# Patient Record
Sex: Male | Born: 1935 | Race: White | Hispanic: No | Smoking: Former smoker
Health system: Southern US, Community
[De-identification: ages and names within clinical notes are randomized; demographics above are authoritative.]

## PROBLEM LIST (undated history)

## (undated) DIAGNOSIS — H409 Unspecified glaucoma: Secondary | ICD-10-CM

## (undated) DIAGNOSIS — E785 Hyperlipidemia, unspecified: Secondary | ICD-10-CM

## (undated) DIAGNOSIS — F0280 Dementia in other diseases classified elsewhere without behavioral disturbance: Secondary | ICD-10-CM

## (undated) DIAGNOSIS — Z950 Presence of cardiac pacemaker: Secondary | ICD-10-CM

## (undated) DIAGNOSIS — J449 Chronic obstructive pulmonary disease, unspecified: Secondary | ICD-10-CM

## (undated) DIAGNOSIS — C3490 Malignant neoplasm of unspecified part of unspecified bronchus or lung: Secondary | ICD-10-CM

## (undated) DIAGNOSIS — G309 Alzheimer's disease, unspecified: Secondary | ICD-10-CM

## (undated) DIAGNOSIS — R339 Retention of urine, unspecified: Secondary | ICD-10-CM

## (undated) DIAGNOSIS — D649 Anemia, unspecified: Secondary | ICD-10-CM

## (undated) HISTORY — DX: Anemia, unspecified: D64.9

## (undated) HISTORY — DX: Presence of cardiac pacemaker: Z95.0

## (undated) HISTORY — PX: PACEMAKER INSERTION: SHX728

## (undated) HISTORY — DX: Malignant neoplasm of unspecified part of unspecified bronchus or lung: C34.90

## (undated) HISTORY — DX: Unspecified glaucoma: H40.9

## (undated) HISTORY — DX: Hyperlipidemia, unspecified: E78.5

## (undated) HISTORY — PX: STOMACH SURGERY: SHX791

## (undated) HISTORY — DX: Alzheimer's disease, unspecified: G30.9

## (undated) HISTORY — DX: Chronic obstructive pulmonary disease, unspecified: J44.9

## (undated) HISTORY — DX: Retention of urine, unspecified: R33.9

## (undated) HISTORY — DX: Dementia in other diseases classified elsewhere, unspecified severity, without behavioral disturbance, psychotic disturbance, mood disturbance, and anxiety: F02.80

---

## 2005-02-18 ENCOUNTER — Ambulatory Visit: Payer: Self-pay | Admitting: Internal Medicine

## 2005-05-25 ENCOUNTER — Ambulatory Visit: Payer: Self-pay | Admitting: Vascular Surgery

## 2005-06-06 ENCOUNTER — Ambulatory Visit: Payer: Self-pay | Admitting: Vascular Surgery

## 2005-06-07 ENCOUNTER — Ambulatory Visit: Payer: Self-pay | Admitting: Vascular Surgery

## 2005-06-28 ENCOUNTER — Ambulatory Visit: Payer: Self-pay | Admitting: Vascular Surgery

## 2005-07-05 ENCOUNTER — Inpatient Hospital Stay: Payer: Self-pay | Admitting: Vascular Surgery

## 2006-10-03 ENCOUNTER — Emergency Department: Payer: Self-pay | Admitting: Emergency Medicine

## 2006-10-04 ENCOUNTER — Ambulatory Visit: Payer: Self-pay | Admitting: Emergency Medicine

## 2008-08-14 ENCOUNTER — Inpatient Hospital Stay: Payer: Self-pay | Admitting: Internal Medicine

## 2009-08-07 ENCOUNTER — Inpatient Hospital Stay: Payer: Self-pay | Admitting: Internal Medicine

## 2009-08-25 ENCOUNTER — Ambulatory Visit: Payer: Self-pay | Admitting: Specialist

## 2009-09-28 ENCOUNTER — Ambulatory Visit: Payer: Self-pay | Admitting: Specialist

## 2009-10-06 ENCOUNTER — Ambulatory Visit: Payer: Self-pay | Admitting: Oncology

## 2009-10-07 ENCOUNTER — Ambulatory Visit: Payer: Self-pay | Admitting: Oncology

## 2009-11-03 ENCOUNTER — Ambulatory Visit: Payer: Self-pay | Admitting: Oncology

## 2009-11-06 ENCOUNTER — Ambulatory Visit: Payer: Self-pay | Admitting: Oncology

## 2009-12-06 ENCOUNTER — Ambulatory Visit: Payer: Self-pay | Admitting: Oncology

## 2009-12-29 ENCOUNTER — Emergency Department: Payer: Self-pay

## 2010-01-06 ENCOUNTER — Ambulatory Visit: Payer: Self-pay | Admitting: Oncology

## 2010-02-05 ENCOUNTER — Ambulatory Visit: Payer: Self-pay | Admitting: Oncology

## 2010-03-31 ENCOUNTER — Ambulatory Visit: Payer: Self-pay | Admitting: Oncology

## 2010-05-08 ENCOUNTER — Ambulatory Visit: Payer: Self-pay | Admitting: Oncology

## 2010-05-11 ENCOUNTER — Ambulatory Visit: Payer: Self-pay | Admitting: Oncology

## 2010-06-08 ENCOUNTER — Ambulatory Visit: Payer: Self-pay | Admitting: Oncology

## 2010-08-12 ENCOUNTER — Ambulatory Visit: Payer: Self-pay | Admitting: Oncology

## 2010-08-17 ENCOUNTER — Ambulatory Visit: Payer: Self-pay | Admitting: Oncology

## 2010-09-08 ENCOUNTER — Ambulatory Visit: Payer: Self-pay | Admitting: Oncology

## 2011-01-01 ENCOUNTER — Emergency Department: Payer: Self-pay | Admitting: Internal Medicine

## 2012-01-24 IMAGING — CT NM PET [PERSON_NAME] LTD AREA
5 series · 25 of 25 positions shown · non-contrast
Comparison: none

REASON FOR EXAM: r middle lobe mass
COMMENTS:

PROCEDURE:     PET - PET/CT DX LUNG CA  - September 28, 2009 [DATE]
RESULT:
HISTORY: Lung cancer.

[Series 3: ct wb 3.0 b30f · axial · 3.0mm · 0.98mm/px · z∈[-78,+790]mm · 12 of 435 slices shown]
[im 1/435  soft-tissue]
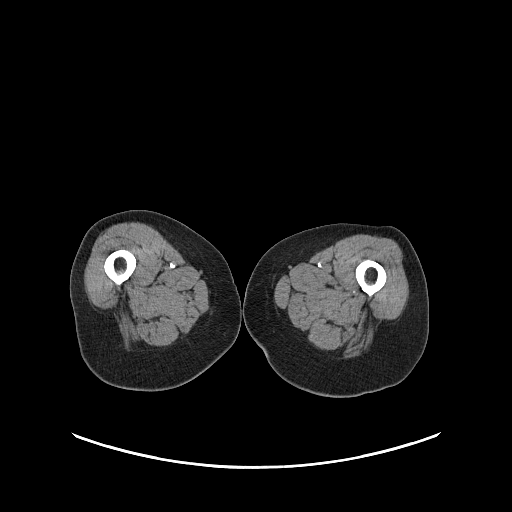
[im 40/435  soft-tissue]
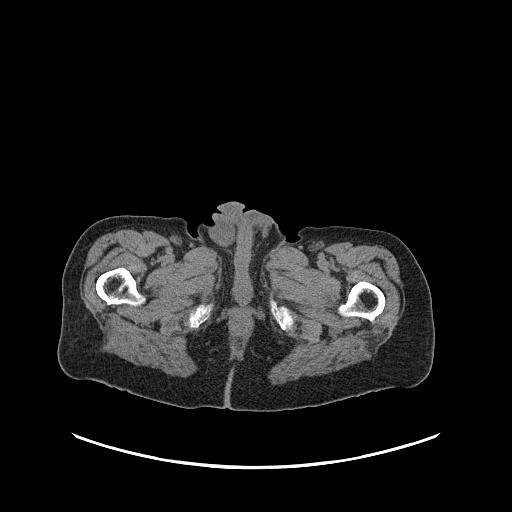
[im 79/435  soft-tissue]
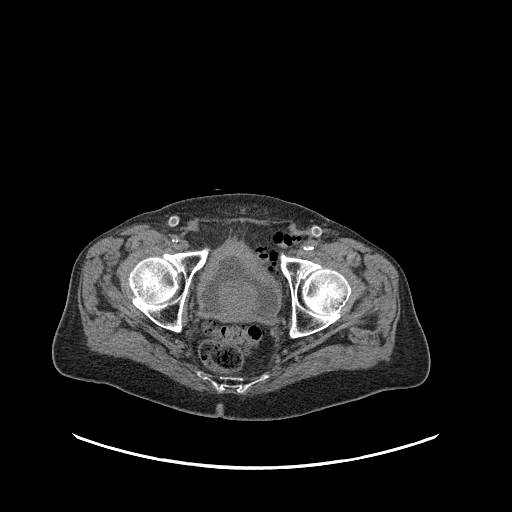
[im 119/435  soft-tissue]
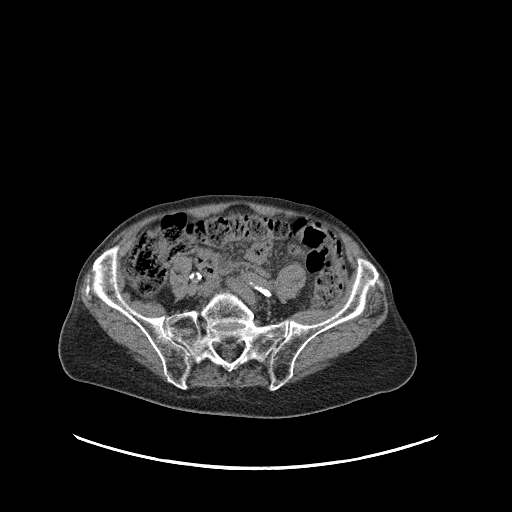
[im 158/435  soft-tissue]
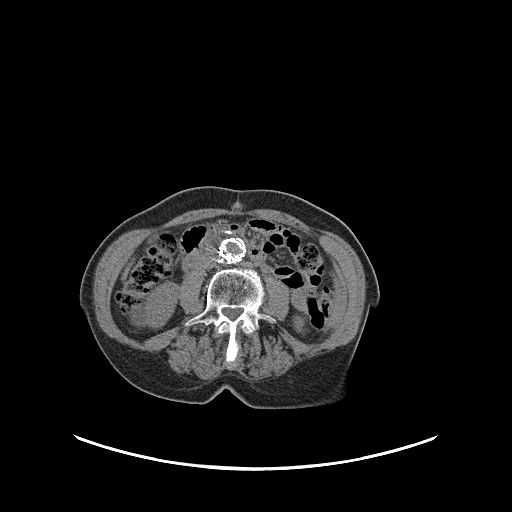
[im 198/435  soft-tissue]
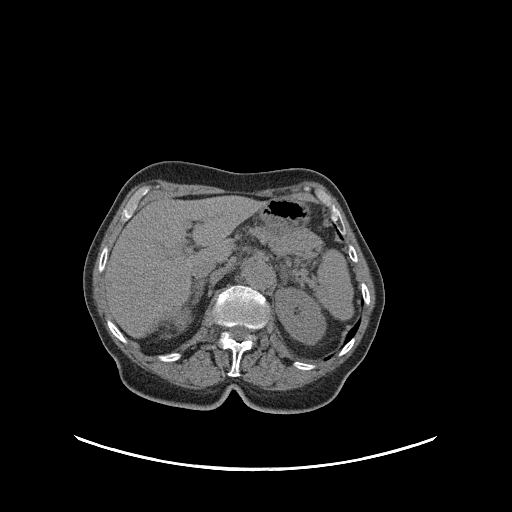
[im 237/435  soft-tissue]
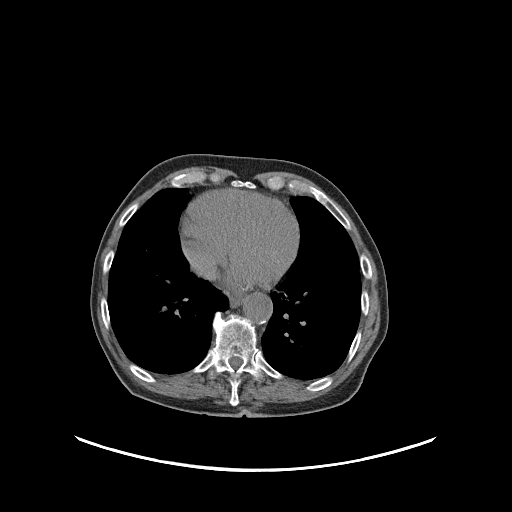
[im 277/435  soft-tissue]
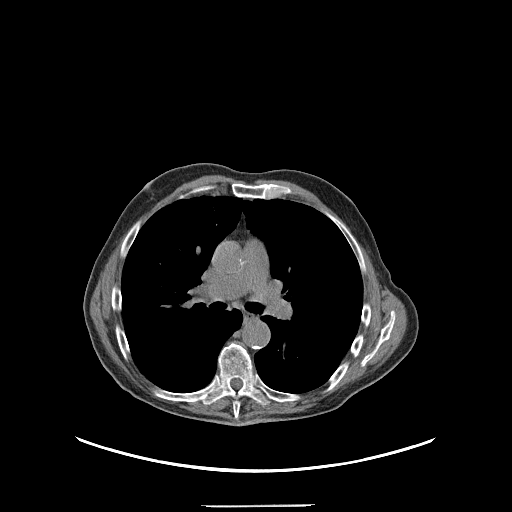
[im 316/435  soft-tissue]
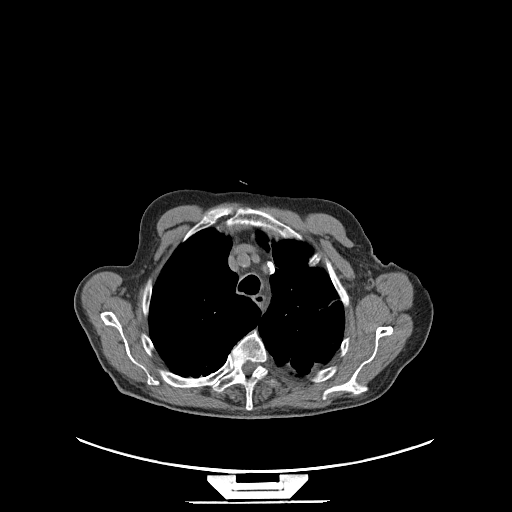
[im 356/435  soft-tissue]
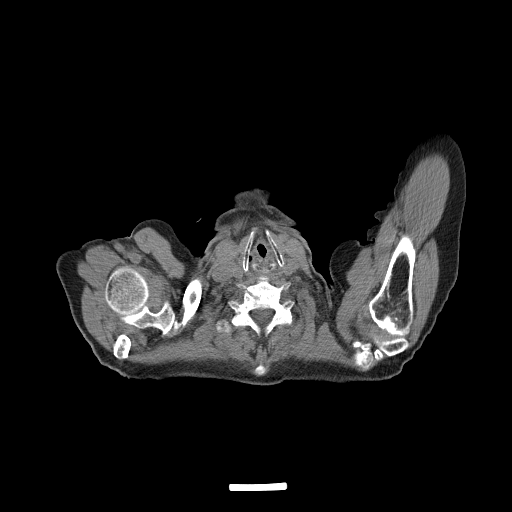
[im 395/435  soft-tissue]
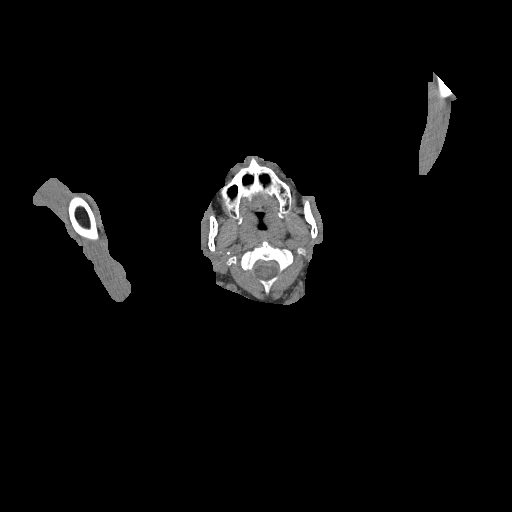
[im 435/435  soft-tissue]
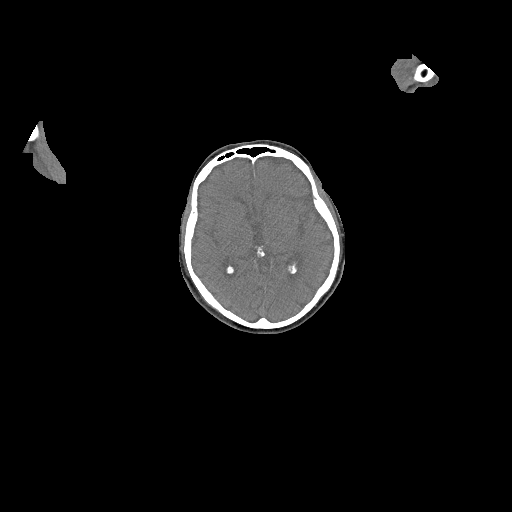

[Series 102: pet wb · axial · 5.0mm · 4.07mm/px · z∈[-76,+790]mm · 7 of 290 slices shown]
[im 1/290]
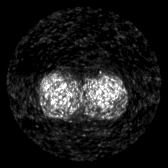
[im 49/290]
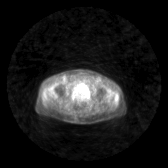
[im 97/290]
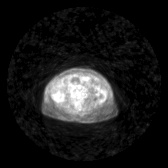
[im 145/290]
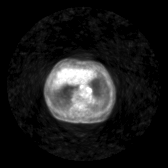
[im 193/290]
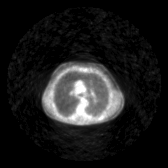
[im 241/290]
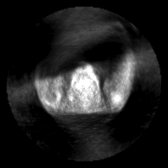
[im 290/290]
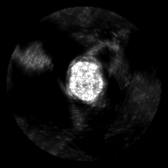

[Series 604: pet coronal · 1 of 55 slices shown]
[im 1/55]
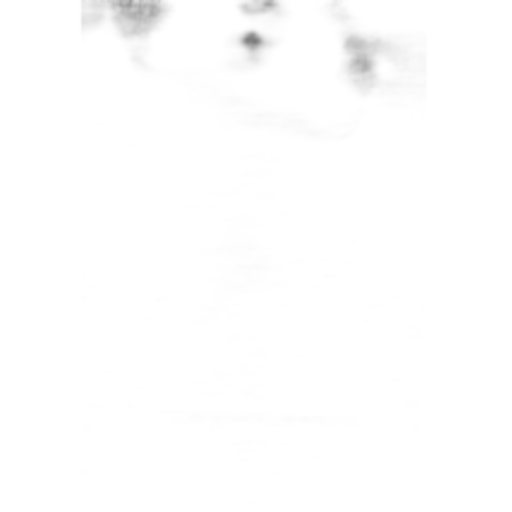

[Series 605: pet sagittal · 1 of 58 slices shown]
[im 1/58]
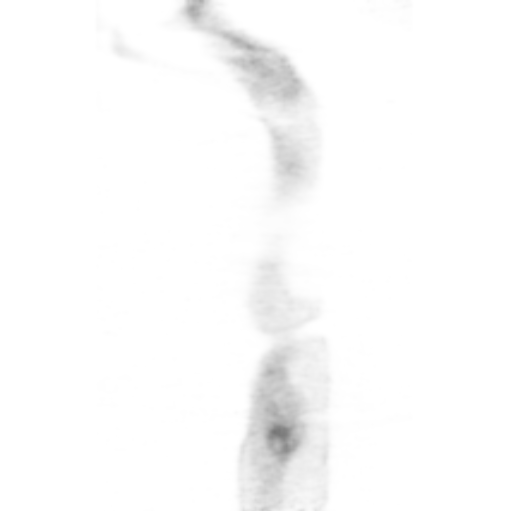

[Series 606: pet axial · 4 of 168 slices shown]
[im 1/168]
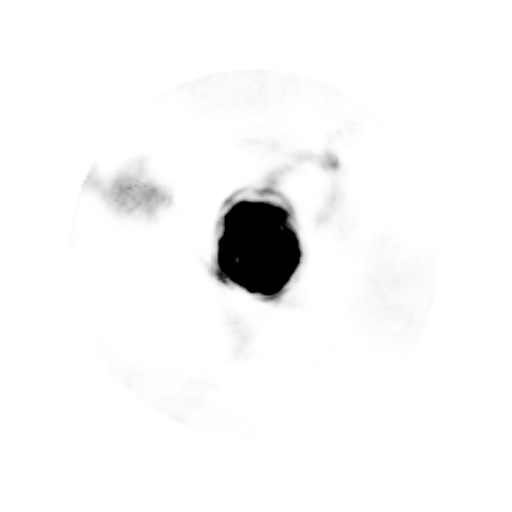
[im 56/168]
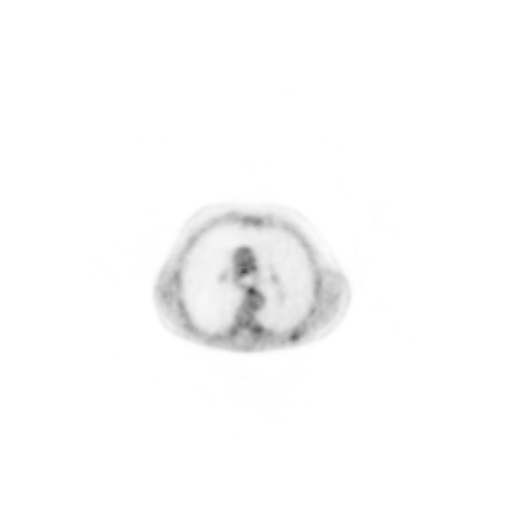
[im 112/168]
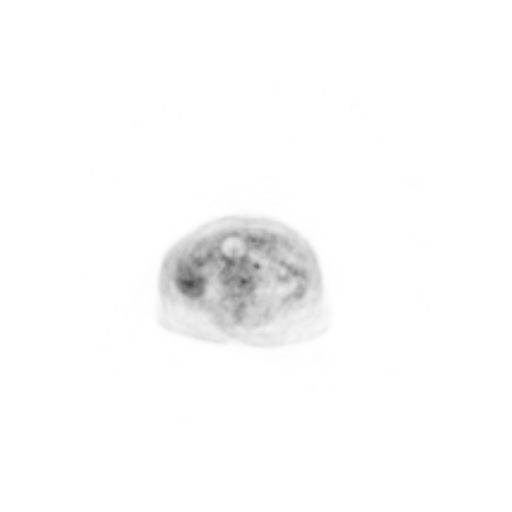
[im 168/168]
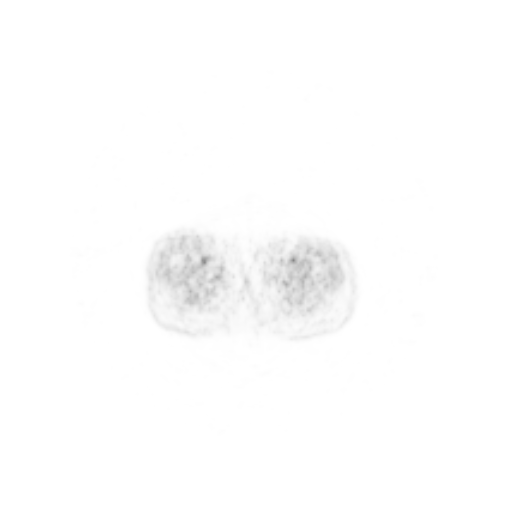

[25 of 25 positions shown; findings below may reference images not displayed]

PROCEDURE AND FINDINGS:  Following a fasting blood sugar determination of 95
mg/dl, 13.87 millicuries of F-18 FDG was administered to the patient and PET
CT was obtained. There is a left upper lobe mass lesion that is intensely
PET positive with SUV of 9. This is consistent with lung carcinoma. The
chest is otherwise unremarkable. Neck is unremarkable. Upper abdomen
demonstrates equivocal area of increased activity. This is in a region of
left base atelectatic change. Mild pneumonitis could present in this
fashion. Also noted in the pelvis is prostatic enlargement with a region of
increased SUV in the posterior right portion of the prostate gland. SUV in
this region is 3.5. This could represent focal area of prostatitis or
prostate cancer. The patient has aortic stent graft. Atelectatic changes in
the right middle lobe are not intensely PET positive.
IMPRESSION: 1. Intensely PET positive left upper lung pulmonary nodule with SUV levels
of 9. This is most consistent with bronchogenic carcinoma.
2. Focal prostatitis and/or prostate cancer right posterior inferior aspect
of the prostate gland. The prostate is enlarged as well.
3. Focal equivocal area of increased activity in the left lung base
medially. This is most likely atelectatic change. Right middle lobe
atelectatic changes present on CT is not intensely PET positive.

## 2012-03-07 ENCOUNTER — Ambulatory Visit: Payer: Self-pay | Admitting: Oncology

## 2012-03-20 ENCOUNTER — Ambulatory Visit: Payer: Self-pay | Admitting: Oncology

## 2012-03-20 LAB — COMPREHENSIVE METABOLIC PANEL
Albumin: 3.4 g/dL (ref 3.4–5.0)
Alkaline Phosphatase: 99 U/L (ref 50–136)
BUN: 21 mg/dL — ABNORMAL HIGH (ref 7–18)
Bilirubin,Total: 0.5 mg/dL (ref 0.2–1.0)
Calcium, Total: 9.2 mg/dL (ref 8.5–10.1)
Chloride: 100 mmol/L (ref 98–107)
Co2: 30 mmol/L (ref 21–32)
EGFR (African American): >60
EGFR (Non-African Amer.): >60
Potassium: 4.7 mmol/L (ref 3.5–5.1)
SGOT(AST): 21 U/L (ref 15–37)
SGPT (ALT): 18 U/L (ref 12–78)
Total Protein: 7.9 g/dL (ref 6.4–8.2)

## 2012-03-20 LAB — CBC CANCER CENTER
Basophil #: 0 x10 3/mm (ref 0.0–0.1)
Basophil %: 0.2 %
HCT: 40.7 % (ref 40.0–52.0)
HGB: 13.2 g/dL (ref 13.0–18.0)
Lymphocyte #: 1.2 x10 3/mm (ref 1.0–3.6)
Lymphocyte %: 13.4 %
MCH: 30.6 pg (ref 26.0–34.0)
MCV: 94 fL (ref 80–100)
Monocyte #: 1.1 x10 3/mm — ABNORMAL HIGH (ref 0.2–1.0)
Monocyte %: 12.1 %
Neutrophil #: 6.5 x10 3/mm (ref 1.4–6.5)
Platelet: 195 x10 3/mm (ref 150–440)
RDW: 15.1 % — ABNORMAL HIGH (ref 11.5–14.5)

## 2012-04-08 ENCOUNTER — Ambulatory Visit: Payer: Self-pay | Admitting: Oncology

## 2012-05-08 ENCOUNTER — Ambulatory Visit: Payer: Self-pay | Admitting: Oncology

## 2012-05-14 LAB — PROTIME-INR
INR: 1
Prothrombin Time: 13.1 secs (ref 11.5–14.7)

## 2012-05-14 LAB — APTT: Activated PTT: 31 secs (ref 23.6–35.9)

## 2012-05-15 ENCOUNTER — Ambulatory Visit: Payer: Self-pay | Admitting: Oncology

## 2012-06-06 LAB — BASIC METABOLIC PANEL
Calcium, Total: 9.2 mg/dL (ref 8.5–10.1)
Chloride: 102 mmol/L (ref 98–107)
Co2: 26 mmol/L (ref 21–32)
Creatinine: 0.97 mg/dL (ref 0.60–1.30)
EGFR (African American): >60
Glucose: 93 mg/dL (ref 65–99)
Osmolality: 280 (ref 275–301)
Sodium: 140 mmol/L (ref 136–145)

## 2012-06-08 ENCOUNTER — Ambulatory Visit: Payer: Self-pay | Admitting: Oncology

## 2012-07-02 ENCOUNTER — Ambulatory Visit: Payer: Self-pay | Admitting: Internal Medicine

## 2012-08-14 ENCOUNTER — Ambulatory Visit: Payer: Self-pay | Admitting: Oncology

## 2012-08-16 ENCOUNTER — Ambulatory Visit: Payer: Self-pay | Admitting: Oncology

## 2012-09-08 ENCOUNTER — Ambulatory Visit: Payer: Self-pay | Admitting: Oncology

## 2012-12-17 ENCOUNTER — Ambulatory Visit: Payer: Self-pay | Admitting: Oncology

## 2012-12-18 ENCOUNTER — Ambulatory Visit: Payer: Self-pay | Admitting: Oncology

## 2013-02-10 ENCOUNTER — Inpatient Hospital Stay: Payer: Self-pay | Admitting: Internal Medicine

## 2013-02-10 DIAGNOSIS — Z950 Presence of cardiac pacemaker: Secondary | ICD-10-CM | POA: Insufficient documentation

## 2013-02-10 LAB — CK TOTAL AND CKMB (NOT AT ARMC)
CK, Total: 38 U/L (ref 35–232)
CK, Total: 73 U/L (ref 35–232)
CK-MB: 1.8 ng/mL (ref 0.5–3.6)

## 2013-02-10 LAB — URINALYSIS, COMPLETE
Bacteria: NONE SEEN
Bilirubin,UR: NEGATIVE
Glucose,UR: NEGATIVE mg/dL (ref 0–75)
Ketone: NEGATIVE
Leukocyte Esterase: NEGATIVE
Nitrite: NEGATIVE
Ph: 5 (ref 4.5–8.0)
Protein: NEGATIVE
RBC,UR: <1 /HPF (ref 0–5)
Specific Gravity: 1.013 (ref 1.003–1.030)
Squamous Epithelial: NONE SEEN
WBC UR: 2 /HPF (ref 0–5)

## 2013-02-10 LAB — COMPREHENSIVE METABOLIC PANEL
Anion Gap: 12 (ref 7–16)
Chloride: 105 mmol/L (ref 98–107)
Creatinine: 1.68 mg/dL — ABNORMAL HIGH (ref 0.60–1.30)
EGFR (Non-African Amer.): 36 — ABNORMAL LOW
Glucose: 166 mg/dL — ABNORMAL HIGH (ref 65–99)
SGPT (ALT): 62 U/L (ref 12–78)
Total Protein: 7.1 g/dL (ref 6.4–8.2)

## 2013-02-10 LAB — CBC
HCT: 40 % (ref 40.0–52.0)
HGB: 13 g/dL (ref 13.0–18.0)
MCH: 31.3 pg (ref 26.0–34.0)
MCHC: 32.6 g/dL (ref 32.0–36.0)
MCV: 96 fL (ref 80–100)
Platelet: 164 10*3/uL (ref 150–440)
RBC: 4.17 10*6/uL — ABNORMAL LOW (ref 4.40–5.90)
RDW: 14.9 % — ABNORMAL HIGH (ref 11.5–14.5)

## 2013-02-10 LAB — MAGNESIUM
Magnesium: 1.7 mg/dL — ABNORMAL LOW
Magnesium: 2.5 mg/dL — ABNORMAL HIGH

## 2013-02-10 LAB — TROPONIN I
Troponin-I: 0.23 ng/mL — ABNORMAL HIGH
Troponin-I: 0.24 ng/mL — ABNORMAL HIGH

## 2013-02-10 LAB — TSH: Thyroid Stimulating Horm: 1.23 u[IU]/mL

## 2013-02-10 LAB — PRO B NATRIURETIC PEPTIDE: B-Type Natriuretic Peptide: 10653 pg/mL — ABNORMAL HIGH (ref 0–450)

## 2013-02-11 LAB — BASIC METABOLIC PANEL
Anion Gap: 9 (ref 7–16)
BUN: 21 mg/dL — ABNORMAL HIGH (ref 7–18)
Calcium, Total: 8.7 mg/dL (ref 8.5–10.1)
Chloride: 108 mmol/L — ABNORMAL HIGH (ref 98–107)
Co2: 24 mmol/L (ref 21–32)
Creatinine: 1.14 mg/dL (ref 0.60–1.30)
EGFR (African American): >60
EGFR (Non-African Amer.): 57 — ABNORMAL LOW
Glucose: 99 mg/dL (ref 65–99)
Osmolality: 284 (ref 275–301)
Sodium: 141 mmol/L (ref 136–145)

## 2013-02-11 LAB — CBC WITH DIFFERENTIAL/PLATELET
Basophil #: 0 10*3/uL (ref 0.0–0.1)
Basophil %: 0.2 %
Eosinophil #: 0 10*3/uL (ref 0.0–0.7)
HCT: 39.7 % — ABNORMAL LOW (ref 40.0–52.0)
HGB: 13.4 g/dL (ref 13.0–18.0)
Lymphocyte #: 0.6 10*3/uL — ABNORMAL LOW (ref 1.0–3.6)
Lymphocyte %: 6.1 %
MCH: 32.2 pg (ref 26.0–34.0)
MCHC: 33.6 g/dL (ref 32.0–36.0)
MCV: 96 fL (ref 80–100)
Monocyte #: 0.9 x10 3/mm (ref 0.2–1.0)
Monocyte %: 9.9 %
Neutrophil #: 7.7 10*3/uL — ABNORMAL HIGH (ref 1.4–6.5)
Neutrophil %: 83.5 %
Platelet: 142 10*3/uL — ABNORMAL LOW (ref 150–440)
RDW: 14.5 % (ref 11.5–14.5)
WBC: 9.2 10*3/uL (ref 3.8–10.6)

## 2013-02-11 LAB — CK TOTAL AND CKMB (NOT AT ARMC): CK, Total: 46 U/L (ref 35–232)

## 2013-02-11 LAB — APTT: Activated PTT: 29.7 s

## 2013-02-11 LAB — PROTIME-INR: Prothrombin Time: 13.9 secs (ref 11.5–14.7)

## 2013-11-02 DIAGNOSIS — E785 Hyperlipidemia, unspecified: Secondary | ICD-10-CM | POA: Insufficient documentation

## 2013-11-02 DIAGNOSIS — J449 Chronic obstructive pulmonary disease, unspecified: Secondary | ICD-10-CM | POA: Insufficient documentation

## 2013-12-26 DIAGNOSIS — R0681 Apnea, not elsewhere classified: Secondary | ICD-10-CM | POA: Insufficient documentation

## 2013-12-29 DIAGNOSIS — D510 Vitamin B12 deficiency anemia due to intrinsic factor deficiency: Secondary | ICD-10-CM | POA: Insufficient documentation

## 2013-12-29 DIAGNOSIS — C3412 Malignant neoplasm of upper lobe, left bronchus or lung: Secondary | ICD-10-CM | POA: Insufficient documentation

## 2013-12-29 DIAGNOSIS — I739 Peripheral vascular disease, unspecified: Secondary | ICD-10-CM | POA: Insufficient documentation

## 2014-11-26 DIAGNOSIS — G301 Alzheimer's disease with late onset: Secondary | ICD-10-CM

## 2014-11-26 DIAGNOSIS — F0281 Dementia in other diseases classified elsewhere with behavioral disturbance: Secondary | ICD-10-CM | POA: Insufficient documentation

## 2014-11-26 DIAGNOSIS — F0280 Dementia in other diseases classified elsewhere without behavioral disturbance: Secondary | ICD-10-CM | POA: Insufficient documentation

## 2014-11-28 NOTE — Op Note (Signed)
PATIENT NAME:  Johnathan Dunn, Johnathan Dunn MR#:  [id number] DATE OF BIRTH:  [birth year]  DATE OF PROCEDURE:  02/11/2013  PRIMARY CARE PHYSICIAN: Tracie Harrier, MD   PREPROCEDURE DIAGNOSIS: Complete heart block.   PROCEDURE: Dual chamber pacemaker implantation.   POSTPROCEDURE DIAGNOSIS: Atrial sensing with ventricular pacing.   INDICATION: The patient is an 81 year old gentleman who presented to [facility] Emergency Room with a 2 to 3-weeks history of shortness of breath, weakness and presyncope. The patient was noted to be bradycardic, EKG demonstrating evidence for AV dissociation or complete heart block. The procedure, risks, benefits and alternatives of permanent pacemaker implantation were explained to the patient, and informed written consent was obtained.   DESCRIPTION OF PROCEDURE: He was brought to the operating room in a fasting state. The left pectoral region was prepped and draped in the usual sterile manner. Anesthesia was obtained with 1% Xylocaine locally. A 6 cm incision was performed over the left pectoral region. The pacemaker pocket was generated by electrocautery and blunt dissection. Access was obtained to the left subclavian vein by fine needle aspiration. Right ventricular (9191) and atrial (5592) leads were positioned in the right ventricular apex and right atrial appendage under fluoroscopic guidance. After proper thresholds were attained, the leads were sutured in place. The pacemaker pocket was irrigated with gentamicin solution. The leads were connected to a dual chamber rate-responsive pacemaker generator (Medtronic Adapta [id number]) and positioned into the pocket. The pocket was closed with 2-0 and 4-0 Vicryl, respectively. Steri-Strips and pressure dressing were applied.  ____________________________ Isaias Cowman, MD ap:cb D: 02/11/2013 13:30:04 ET T: 02/11/2013 20:24:49 ET JOB#: [id number]  cc: Isaias Cowman, MD, <Dictator> Isaias Cowman MD ELECTRONICALLY SIGNED  02/26/2013 12:29

## 2014-11-28 NOTE — Discharge Summary (Signed)
PATIENT NAME:  Johnathan Dunn, Johnathan Dunn MR#:  [id number] DATE OF BIRTH:  Aug 31, 1922  DATE OF ADMISSION:  02/10/2013 DATE OF DISCHARGE:  02/12/2013  DISCHARGE DIAGNOSES: 1.  Third-degree arteriovenous block.  2.  Encephalopathy.   DISCHARGE MEDICATIONS: 1.  Pravachol 40 mg a day.  2.  Aspirin 81 mg a day.  3.  Tramadol 50 mg, 1 to 2 every 6 hours p.r.n.   HISTORY AND PHYSICAL: Please see detailed history and physical done on admission.   HOSPITAL COURSE: The patient was admitted weak, pulse on the 20s, with an escape rhythm noted. Found to be in third-degree AV block. Cardiology was consulted. Temporary pacemaker was put in. Eventually permanent pacemaker was put in. He was confused after the pacemaker and overnight, receiving several medications.   Physical therapy saw him. Was safe to ambulate. Confusion was clearing. He was then discharged to home after discussion with the family. Cardiology will follow up soon re  pacemaker wound, etc. Workup was unremarkable otherwise. Chest x-ray, post-pacemaker showed atelectasis only.   CK, CK-MB were normal. Minimal bump in troponin, which was not thought to be an acute coronary syndrome. UA was normal as well.   TSH was, in fact, normal.    ____________________________ Ocie Cornfield. Ouida Sills, MD mwa:dm D: 02/14/2013 10:10:26 ET T: 02/14/2013 10:48:16 ET JOB#: [id number]  cc: Ocie Cornfield. Ouida Sills, MD, <Dictator> Kirk Ruths MD ELECTRONICALLY SIGNED 02/15/2013 6:49

## 2014-11-28 NOTE — H&P (Signed)
PATIENT NAME:  Johnathan, Dunn MR#:  [id number] DATE OF BIRTH:  1922/12/01  DATE OF ADMISSION:  02/10/2013  CHIEF COMPLAINT: Shortness of breath, weakness, lightheadedness.   HISTORY OF PRESENT ILLNESS: Johnathan Dunn is an 81 year old with a history of hypertension, coronary artery disease, hyperlipidemia, COPD, who presented the ED brought by family complaining of shortness of breath for the last 2 to 3 weeks. The patient also reports weakness associated with lightheadedness. Denies any loss of consciousness. No ankle edema. No chest pain. In the ED the patient was noted to have a heart rate of 27, and was noted to be tachypneic. EKG showed evidence of third-degree AV block, with a complete dissociation and junctional rhythm, with a rate of 28. Patient's troponin was also slightly elevated 0.23.   PAST MEDICAL HISTORY: Significant for COPD, abdominal aortic aneurysm status post stent in 2007, history of carpal tunnel syndrome, history of BPH, history of previous stroke, history of hyperlipidemia, occluded right iliac artery and coronary artery disease.   FAMILY HISTORY: Positive for Alzheimer's, stroke, and lung cancer.   SOCIAL HISTORY: The patient is widowed. Used to smoke a pack of cigarettes a day, but states he quit about a month back. Occasionally drinks alcohol.   CURRENT MEDICATIONS: Aspirin 81 mg a day, pravastatin 40 mg once a day.   Denied any known drug other than Donepezil which caused some hallucinations.   PHYSICAL EXAMINATION: VITAL SIGNS: Blood pressure 150/57, heart rate was 27, temperature was 97.6, respirations  18, O2 sat 94% on room air.  GENERAL: He was not in any acute distress, but appeared anxious.  HEENT: NCAT.  NECK: Supple.  HEART: S1 and S2. Bradycardic.  LUNGS: Bilateral inspiratory and expiratory wheezes.   ABDOMEN: Soft, nontender.  EXTREMITIES: No edema.  NEUROLOGIC: Alert and oriented x 3. No obvious focal signs.   EKG showed evidence for junctional  rhythm with fusion complexes, incomplete right bundle branch block and heart rate of 27. The patient underwent temporary pacemaker placement by  Dr. Humphrey Rolls to the right groin and appeared to tolerate the procedure well.   LABS: Glucose 166, BUN 30, creatinine 1.68, sodium 139, potassium 4.7, chloride 105, CO2 22, EGFR 36. CK was 38, MB was 1.8, troponin 0.23. TSH 1.23, hemoglobin 13, WBC count 9.1, platelet count 164.   IMPRESSIONS: Symptomatic bradycardia with third-degree AV block and junctional rhythm, heart rate of 27, status post temporary pacemaker through the right groin.   1. Will admit patient to the critical care unit for further monitoring and consideration of permanent pacemaker. Will also consult Dr. Saralyn Pilar for this.  2.  COPD: Chronic obstructive pulmonary disease, currently not on inhalers.  3.  Acute renal failure: Will continue to monitor closely.  4.  Peripheral vascular disease: Continue aspirin and statin. Will also cycle cardiac enzymes.  5.  History of lung cancer; appears to be in remission at this point.   THE PATIENT APPEARS TO BE A FULL CODE.   Time of the dictation: About 30 minutes.    ____________________________ Tracie Harrier, MD vh:dm D: 02/10/2013 12:41:47 ET T: 02/10/2013 13:46:57 ET JOB#: [id number]  cc: Tracie Harrier, MD, <Dictator> Tracie Harrier MD ELECTRONICALLY SIGNED 02/28/2013 19:16

## 2014-11-28 NOTE — Consult Note (Signed)
PATIENT NAME:  Johnathan Dunn, Johnathan Dunn MR#:  [id number] DATE OF BIRTH:  May 25, 1923  CARDIOLOGY CONSULTATION  DATE OF CONSULTATION:  02/10/2013  HISTORY OF PRESENT ILLNESS: This 81 year old white male with a past medical history of peripheral vascular disease, hypercholesterolemia, who came into the Emergency Room with shortness of breath. The patient's daughter states he has been short of breath since Wednesday, and today is Sunday, and was finally brought in because of worsening of the shortness of breath. He denied any chest pain, denies any prior history of coronary artery disease or ever seeing any cardiologist in the past. The patient is followed normally by Dr. Ouida Sills.   PAST MEDICAL HISTORY: History of hypercholesterolemia, he takes Pravachol for that. He has a history of having a stent implanted right femoral by Dr. Collins Scotland in the past.   SOCIAL HISTORY: He denies EtOH abuse or smoking.   FAMILY HISTORY: Positive for coronary artery disease.   MEDICATIONS: Pravachol. No other medications. No beta blockers. No calcium channel blocker or digoxin.   PHYSICAL EXAMINATION: VITAL SIGNS: His blood pressure was 130/70, his respirations were 25. At one time his blood pressure went down to 110/50, but was maintaining his blood pressure, however heart rate was 28.  NECK: Positive JVD.  LUNGS: There were crackles at the bases.  HEART EXAM: Shows irregular rate and rhythm, with bradycardia about 25 beats per minute.  ABDOMEN: Soft, nontender, positive bowel sounds.  EXTREMITIES: No pedal edema. Right femoral pulses are very weak.   EKG showed complete dissociation with junctional rhythm at rate of 28 beats per minute. Troponin was slightly elevated at 0.23. His creatinine was also elevated at 1.68, BUN is 30, His CPK was 38.   ASSESSMENT AND PLAN: The patient in complete AV block with junctional rhythm, rate  28, in congestive heart failure. Discussed situation with the daughters. The patient has been  seen by Dr. Ouida Sills. Has no cardiologist.   Hanley Seamen the option of calling a cardiologist by contacting whoever is covering for [facility] and get cardiology from [facility] Johnathan Dunn to see the patient, but the family insisted that something needs to be done right away, and they felt that I should proceed with putting a temporary pacemaker in, and since I am on-call on in the Emergency Room  cardiology this temporary pacemaker was implanted in the right groin under sterile conditions, pacing now at 70 beats per minute.   Will see how the patient is doing in the morning and we will probably get Dr. Saralyn Pilar to put a permanent pacemaker in if there is no change.   Thank you very much for the referral.    ___________________________ Dionisio David, MD sak:dm D: 02/10/2013 11:31:42 ET T: 02/10/2013 12:00:09 ET JOB#: [id number]  cc: Dionisio David, MD, <Dictator> Dionisio David MD ELECTRONICALLY SIGNED 02/12/2013 16:32

## 2015-08-19 ENCOUNTER — Other Ambulatory Visit: Payer: Self-pay | Admitting: *Deleted

## 2015-08-19 ENCOUNTER — Telehealth: Payer: Self-pay | Admitting: Oncology

## 2015-08-19 DIAGNOSIS — G301 Alzheimer's disease with late onset: Secondary | ICD-10-CM | POA: Diagnosis not present

## 2015-08-19 DIAGNOSIS — Z950 Presence of cardiac pacemaker: Secondary | ICD-10-CM | POA: Diagnosis not present

## 2015-08-19 DIAGNOSIS — F0281 Dementia in other diseases classified elsewhere with behavioral disturbance: Secondary | ICD-10-CM | POA: Diagnosis not present

## 2015-08-19 DIAGNOSIS — C3490 Malignant neoplasm of unspecified part of unspecified bronchus or lung: Secondary | ICD-10-CM

## 2015-08-19 DIAGNOSIS — J42 Unspecified chronic bronchitis: Secondary | ICD-10-CM | POA: Diagnosis not present

## 2015-08-19 DIAGNOSIS — R0602 Shortness of breath: Secondary | ICD-10-CM | POA: Diagnosis not present

## 2015-08-19 DIAGNOSIS — E7800 Pure hypercholesterolemia, unspecified: Secondary | ICD-10-CM | POA: Diagnosis not present

## 2015-08-19 NOTE — Telephone Encounter (Signed)
MD wishes to see patient on Friday 1/13 in [place] with cbc, met c. Please schedule patient and notify pt with appt details.

## 2015-08-19 NOTE — Telephone Encounter (Signed)
Patty said they did not bring patient for his follow up appointments and that they would like to bring him now because he is having trouble breathing and has lost weight. She indicated he has recently had a virus and took antibiotics which may also be contributing to the breathing trouble. He has followed up regularly with PCP, Dr. Ouida Sills, and has seen his cardiologist as well, but has not followed up with Dr. Oliva Bustard since 2014. Can we add him to schedule or do we need a new referral? Please advise. Thanks.

## 2015-08-21 ENCOUNTER — Inpatient Hospital Stay: Payer: Self-pay | Admitting: Oncology

## 2015-08-21 ENCOUNTER — Inpatient Hospital Stay: Payer: Self-pay

## 2015-08-21 ENCOUNTER — Telehealth: Payer: Self-pay | Admitting: Oncology

## 2015-08-21 NOTE — Telephone Encounter (Signed)
They are aware that Dr. Oliva Bustard wanted to see him 08/21/15 but called to r/s for 08/26/15 due to transportation issues.

## 2015-08-26 ENCOUNTER — Inpatient Hospital Stay (HOSPITAL_BASED_OUTPATIENT_CLINIC_OR_DEPARTMENT_OTHER): Payer: PPO

## 2015-08-26 ENCOUNTER — Inpatient Hospital Stay: Payer: PPO | Attending: Oncology | Admitting: Oncology

## 2015-08-26 VITALS — BP 111/72 | HR 97 | Temp 97.4°F | Resp 18 | Wt 146.1 lb

## 2015-08-26 DIAGNOSIS — R05 Cough: Secondary | ICD-10-CM | POA: Diagnosis not present

## 2015-08-26 DIAGNOSIS — Z923 Personal history of irradiation: Secondary | ICD-10-CM | POA: Insufficient documentation

## 2015-08-26 DIAGNOSIS — I2510 Atherosclerotic heart disease of native coronary artery without angina pectoris: Secondary | ICD-10-CM | POA: Diagnosis not present

## 2015-08-26 DIAGNOSIS — I10 Essential (primary) hypertension: Secondary | ICD-10-CM | POA: Diagnosis not present

## 2015-08-26 DIAGNOSIS — Z85118 Personal history of other malignant neoplasm of bronchus and lung: Secondary | ICD-10-CM | POA: Insufficient documentation

## 2015-08-26 DIAGNOSIS — J449 Chronic obstructive pulmonary disease, unspecified: Secondary | ICD-10-CM | POA: Insufficient documentation

## 2015-08-26 DIAGNOSIS — Z79899 Other long term (current) drug therapy: Secondary | ICD-10-CM | POA: Diagnosis not present

## 2015-08-26 DIAGNOSIS — C3490 Malignant neoplasm of unspecified part of unspecified bronchus or lung: Secondary | ICD-10-CM

## 2015-08-26 DIAGNOSIS — F17210 Nicotine dependence, cigarettes, uncomplicated: Secondary | ICD-10-CM | POA: Diagnosis not present

## 2015-08-26 DIAGNOSIS — R0602 Shortness of breath: Secondary | ICD-10-CM | POA: Insufficient documentation

## 2015-08-26 LAB — CBC WITH DIFFERENTIAL/PLATELET
Basophils Absolute: 0 K/uL (ref 0–0.1)
Basophils Relative: 0 %
Eosinophils Absolute: 0 K/uL (ref 0–0.7)
Eosinophils Relative: 0 %
HCT: 47.3 % (ref 40.0–52.0)
Hemoglobin: 15.5 g/dL (ref 13.0–18.0)
Lymphocytes Relative: 13 %
Lymphs Abs: 1.5 K/uL (ref 1.0–3.6)
MCH: 28 pg (ref 26.0–34.0)
MCHC: 32.7 g/dL (ref 32.0–36.0)
MCV: 85.7 fL (ref 80.0–100.0)
Monocytes Absolute: 1 K/uL (ref 0.2–1.0)
Monocytes Relative: 8 %
Neutro Abs: 8.9 K/uL — ABNORMAL HIGH (ref 1.4–6.5)
Neutrophils Relative %: 79 %
Platelets: 255 K/uL (ref 150–440)
RBC: 5.52 MIL/uL (ref 4.40–5.90)
RDW: 15.9 % — ABNORMAL HIGH (ref 11.5–14.5)
WBC: 11.4 K/uL — ABNORMAL HIGH (ref 3.8–10.6)

## 2015-08-26 LAB — COMPREHENSIVE METABOLIC PANEL
ALBUMIN: 3.4 g/dL — AB (ref 3.5–5.0)
ALT: 11 U/L — ABNORMAL LOW (ref 17–63)
ANION GAP: 12 (ref 5–15)
AST: 16 U/L (ref 15–41)
Alkaline Phosphatase: 71 U/L (ref 38–126)
BUN: 15 mg/dL (ref 6–20)
CO2: 23 mmol/L (ref 22–32)
Calcium: 9.2 mg/dL (ref 8.9–10.3)
Chloride: 102 mmol/L (ref 101–111)
Creatinine, Ser: 0.99 mg/dL (ref 0.61–1.24)
GFR calc Af Amer: >60 mL/min (ref >60)
GFR calc non Af Amer: >60 mL/min (ref >60)
Glucose, Bld: 118 mg/dL — ABNORMAL HIGH (ref 65–99)
POTASSIUM: 4.3 mmol/L (ref 3.5–5.1)
SODIUM: 137 mmol/L (ref 135–145)
TOTAL PROTEIN: 7.2 g/dL (ref 6.5–8.1)
Total Bilirubin: 0.8 mg/dL (ref 0.3–1.2)

## 2015-08-26 MED ORDER — AZITHROMYCIN 500 MG PO TABS: 500.0000 mg | ORAL_TABLET | Freq: Every day | ORAL | Status: DC

## 2015-08-26 NOTE — Progress Notes (Signed)
Patient accompanied by granddaughter today.  Patient has dementia and granddaughter is speaking for him.  Patient has history of lung cancer.  Has lost from 165# to 149# in 3 weeks..  Prior CXR shows something in his lung.  Prior to x-ray patient had labored breathing, lack of appetite, sleeping a lot and generally not feeling well.  No cold symptoms.  Dr. Tresa Moore placed him on abx and prednisone.  Did improve some then developed a cold.Marland Kitchen

## 2015-08-26 NOTE — Progress Notes (Signed)
[place] @ [facility] Telephone:([phone]  Fax:([phone]) [place] OB: 01-29-1923  MR#: [id number]  MPN#:[id number]  No care team member to display  CHIEF COMPLAINT:  Chief Complaint  Patient presents with  . Lung Cancer  ubjective:  Chief Complaint/Diagnosis:   Patient has abnormal CT scan of the chest and a PET scan, history of continuing tobacco abuse  Negative bronchoscopy carcinoma of lung (diagnosis on clinical ground) has finished radiation therapy (June, 2011) repeat CT scan in September of 2013 shows progressive disease biopsy of lung mass (October, 2013) negative for malignant cell   No history exists.    No flowsheet data found.  INTERVAL HISTORY:   82 year old gentleman started having cough yellowish expectoration increasing shortness of breath poor appetite patient was seen by primary care physician was treated with antibiotic and prednisone therapy without much relief.  Family wanted me to reevaluate this patient because of progressive decline of condition. History of suspected lung cancer for which patient received radiation therapy No nausea no vomiting poor appetite patient is in wheelchair.  With the meted ambulation.  Patient does not smoke.  No chills or fever reported REVIEW OF SYSTEMS:   Gen. status: Declining performance status.  HEENT denies any headache.  No visual disturbances.  No difficulty in swallowing.  No soreness in the mouth. Lungs: Increasing shortness of breath.  Increasing cough. GI: Poor appetite and nausea no vomiting no rectal bleeding GU: No dysuria hematuria Skin: No rash Musculoskeletal system diffuse bony pain Neurological system: Patient is alert oriented without any focal signs All other 12 systems have been reviewed As per HPI. Otherwise, a complete review of systems is negatve.  Pignificant History/PMH:   benign prostatic hypertrophy w/elev. psa level:    CVA:    stoke:    occluded right iliac artery:      HTN:    hyperlipids:    CAD:    copd:    aneurysm, aortic:    Femoral-popliteal bypass graft:    daily drinker (one mixed drink):    smoker one pack a day:   Preventive Screening:   Has patient had any of the following test? Prostate Exam (1)    Last Prostate Exam: almost a year ago(1)   Smoking History: Smoking History Never Smoked.(1)  PFSH:  Comments: Family history of coronary artery disease,No family history of colorectal cancer, breast cancer, or ovarian cancer.   Comments: Does smoke for several years and continues to smoke, does drink regularly   Additional Past Medical and Surgical History: Peripheral vascular disease.    History of cerebrovascular disease.    Atrial fibrillation.    Emphysema chronic obstructive pulmonary disease.    Coronary artery disease include left ventricular ejection fraction of 55%   ADVANCED DIRECTIVES:  Patient does have advance healthcare directive, Patient   does not desire to make any changes HEALTH MAINTENANCE: Social History  Substance Use Topics  . Smoking status: Not on file  . Smokeless tobacco: Not on file  . Alcohol Use: Not on file      Allergies  Allergen Reactions  . Donepezil Other (See Comments)    Current Outpatient Prescriptions  Medication Sig Dispense Refill  . amoxicillin-clavulanate (AUGMENTIN) 875-125 MG tablet   0  . aspirin EC 81 MG tablet Take by mouth.    . budesonide-formoterol (SYMBICORT) 160-4.5 MCG/ACT inhaler INHALE 2 PUFFS BY MOUTH TWICE DAILY AS DIRECTED    . cyanocobalamin (,VITAMIN B-12,) 1000 MCG/ML injection Inject  into the muscle.    . pravastatin (PRAVACHOL) 40 MG tablet TAKE 1 TABLET BY MOUTH EVERY DAY    . tiotropium (SPIRIVA HANDIHALER) 18 MCG inhalation capsule INHALE 1 CAPSULE BY MOUTH VIA HANDIHALER EVERY DAY    . tobramycin (TOBREX) 0.3 % ophthalmic solution Apply to eye.     No current facility-administered medications for this visit.  Significant History/PMH:    benign prostatic hypertrophy w/elev. psa level:    CVA:    stoke:    occluded right iliac artery:    HTN:    hyperlipids:    CAD:    copd:    aneurysm, aortic:    Femoral-popliteal bypass graft:    daily drinker (one mixed drink):    smoker one pack a day:   Preventive Screening:   Has patient had any of the following test? Prostate Exam (1)    Last Prostate Exam: almost a year ago(1)   Smoking History: Smoking History Never Smoked.(1)  PFSH:  Comments: Family history of coronary artery disease,No family history of colorectal cancer, breast cancer, or ovarian cancer.   Comments: Does smoke for several years and continues to smoke, does drink regularly   Additional Past Medical and Surgical History: Peripheral vascular disease.    History of cerebrovascular disease.    Atrial fibrillation.    Emphysema chronic obstructive pulmonary disease.    Coronary artery disease include left ventricular ejection fraction of 55%    OBJECTIVE:  Filed Vitals:   08/26/15 1539  BP: 111/72  Pulse: 97  Temp: 97.4 F (36.3 C)  Resp: 18     There is no height on file to calculate BMI.    ECOG FS:2 - Symptomatic, <50% confined to bed  PHYSICAL EXAM: General status: Patient is alert oriented in the wheelchair.  Performance status is poor Lungs: Emphysematous chest.  Crepitation in the left base. Cardiac: Tachycardia .  Soft systolic murmur Abdominal exam revealed normal bowel sounds. The abdomen was soft, non-tender, and without masses, organomegaly, or appreciable enlargement of the abdominal aorta Examination of the skin revealed no evidence of significant rashes, suspicious appearing nevi or other concerning lesions. Neurological system is difficult to examine but no other focal signs ambulation is difficult Musculoskeletal system no bony fracture or course joint swelling Extremity no swelling Head exam was generally normal. There was no scleral icterus or corneal arcus.  Mucous membranes were moist. All other systems have been examining LAB RESUL.  .TS:  CBC Latest Ref Rng 08/26/2015 02/11/2013  WBC 3.8 - 10.6 K/uL 11.4(H) 9.2  Hemoglobin 13.0 - 18.0 g/dL 15.5 13.4  Hematocrit 40.0 - 52.0 % 47.3 39.7(L)  Platelets 150 - 440 K/uL 255 142(L)    Appointment on 08/26/2015  Component Date Value Ref Range Status  . WBC 08/26/2015 11.4* 3.8 - 10.6 K/uL Final  . RBC 08/26/2015 5.52  4.40 - 5.90 MIL/uL Final  . Hemoglobin 08/26/2015 15.5  13.0 - 18.0 g/dL Final  . HCT 08/26/2015 47.3  40.0 - 52.0 % Final  . MCV 08/26/2015 85.7  80.0 - 100.0 fL Final  . MCH 08/26/2015 28.0  26.0 - 34.0 pg Final  . MCHC 08/26/2015 32.7  32.0 - 36.0 g/dL Final  . RDW 08/26/2015 15.9* 11.5 - 14.5 % Final  . Platelets 08/26/2015 255  150 - 440 K/uL Final  . Neutrophils Relative % 08/26/2015 79   Final  . Neutro Abs 08/26/2015 8.9* 1.4 - 6.5 K/uL Final  . Lymphocytes Relative 08/26/2015 13  Final  . Lymphs Abs 08/26/2015 1.5  1.0 - 3.6 K/uL Final  . Monocytes Relative 08/26/2015 8   Final  . Monocytes Absolute 08/26/2015 1.0  0.2 - 1.0 K/uL Final  . Eosinophils Relative 08/26/2015 0   Final  . Eosinophils Absolute 08/26/2015 0.0  0 - 0.7 K/uL Final  . Basophils Relative 08/26/2015 0   Final  . Basophils Absolute 08/26/2015 0.0  0 - 0.1 K/uL Final  . Sodium 08/26/2015 137  135 - 145 mmol/L Final  . Potassium 08/26/2015 4.3  3.5 - 5.1 mmol/L Final  . Chloride 08/26/2015 102  101 - 111 mmol/L Final  . CO2 08/26/2015 23  22 - 32 mmol/L Final  . Glucose, Bld 08/26/2015 118* 65 - 99 mg/dL Final  . BUN 08/26/2015 15  6 - 20 mg/dL Final  . Creatinine, Ser 08/26/2015 0.99  0.61 - 1.24 mg/dL Final  . Calcium 08/26/2015 9.2  8.9 - 10.3 mg/dL Final  . Total Protein 08/26/2015 7.2  6.5 - 8.1 g/dL Final  . Albumin 08/26/2015 3.4* 3.5 - 5.0 g/dL Final  . AST 08/26/2015 16  15 - 41 U/L Final  . ALT 08/26/2015 11* 17 - 63 U/L Final  . Alkaline Phosphatase 08/26/2015 71  38 - 126 U/L Final    . Total Bilirubin 08/26/2015 0.8  0.3 - 1.2 mg/dL Final  . GFR calc non Af Amer 08/26/2015 >60  >60 mL/min Final  . GFR calc Af Amer 08/26/2015 >60  >60 mL/min Final   Comment: (NOTE) The eGFR has been calculated using the CKD EPI equation. This calculation has not been validated in all clinical situations. eGFR's persistently <60 mL/min signify possible Chronic Kidney Disease.   . Anion gap 08/26/2015 12  5 - 15 Final       STUDIES:  restriction was done in primary care's office and I do not have any result available for may need to review    ASSESSMENT: Carcinoma of lung at present time possibility of recurrent or progressing disease cannot be ruled out   multiple other comorbid condition might be reason why this elderly gentleman's overall condition has been declining.  MEDICAL DECISION MAking ; PROCEED WITH CT SCAN or PET scan whatever insurance allows to rule out any metastases disease Reevaluate patient after that information is available  Leukocytosis of patient will be started on antibiotic with Levaquin .   ient expressed understanding and was in agreement with this plan. He also understands that He can call clinic at any time with any questions, concerns, or complaints.    No matching staging information was found for the patient.  Forest Gleason, MD   08/26/2015 3:50 PM

## 2015-08-27 ENCOUNTER — Encounter: Payer: Self-pay | Admitting: Oncology

## 2015-08-31 ENCOUNTER — Ambulatory Visit
Admission: RE | Admit: 2015-08-31 | Discharge: 2015-08-31 | Disposition: A | Discharge status: Home / Self Care | Payer: PPO | Source: Ambulatory Visit | Attending: Oncology | Admitting: Oncology

## 2015-08-31 DIAGNOSIS — Z0189 Encounter for other specified special examinations: Secondary | ICD-10-CM | POA: Diagnosis not present

## 2015-08-31 DIAGNOSIS — K8020 Calculus of gallbladder without cholecystitis without obstruction: Secondary | ICD-10-CM | POA: Insufficient documentation

## 2015-08-31 DIAGNOSIS — C3492 Malignant neoplasm of unspecified part of left bronchus or lung: Secondary | ICD-10-CM | POA: Diagnosis not present

## 2015-08-31 DIAGNOSIS — N400 Enlarged prostate without lower urinary tract symptoms: Secondary | ICD-10-CM | POA: Insufficient documentation

## 2015-08-31 DIAGNOSIS — C3490 Malignant neoplasm of unspecified part of unspecified bronchus or lung: Secondary | ICD-10-CM

## 2015-08-31 LAB — GLUCOSE, CAPILLARY: Glucose-Capillary: 97 mg/dL (ref 65–99)

## 2015-08-31 MED ORDER — FLUDEOXYGLUCOSE F - 18 (FDG) INJECTION
12.3500 | Freq: Once | INTRAVENOUS | Status: AC | PRN
  Administered 2015-08-31: 12.35 via INTRAVENOUS

## 2015-09-02 ENCOUNTER — Inpatient Hospital Stay (HOSPITAL_BASED_OUTPATIENT_CLINIC_OR_DEPARTMENT_OTHER): Payer: PPO | Admitting: Oncology

## 2015-09-02 ENCOUNTER — Encounter: Payer: Self-pay | Admitting: Oncology

## 2015-09-02 VITALS — BP 102/70 | HR 82 | Temp 96.2°F | Resp 18 | Wt 146.0 lb

## 2015-09-02 DIAGNOSIS — Z923 Personal history of irradiation: Secondary | ICD-10-CM

## 2015-09-02 DIAGNOSIS — Z85118 Personal history of other malignant neoplasm of bronchus and lung: Secondary | ICD-10-CM | POA: Diagnosis not present

## 2015-09-02 DIAGNOSIS — F17210 Nicotine dependence, cigarettes, uncomplicated: Secondary | ICD-10-CM | POA: Diagnosis not present

## 2015-09-02 DIAGNOSIS — Z79899 Other long term (current) drug therapy: Secondary | ICD-10-CM

## 2015-09-02 DIAGNOSIS — C3490 Malignant neoplasm of unspecified part of unspecified bronchus or lung: Secondary | ICD-10-CM

## 2015-09-02 NOTE — Progress Notes (Signed)
[place] @ [facility] [phone]  Fax:(336) [place]: 06/06/1923  MR#: [id number]  EHM#:[id number]  Patient Care Team: Kirk Ruths, MD as PCP - General (Internal Medicine)  CHIEF COMPLAINT:  Chief Complaint  Patient presents with  . Lung Cancer  ubjective:  Chief Complaint/Diagnosis:   Patient has abnormal CT scan of the chest and a PET scan, history of continuing tobacco abuse  Negative bronchoscopy carcinoma of lung (diagnosis on clinical ground) has finished radiation therapy (June, 2011) repeat CT scan in September of 2013 shows progressive disease biopsy of lung mass (October, 2013) negative for malignant cell   No history exists.    No flowsheet data found.  INTERVAL HISTORY:   82 year old gentleman started having cough yellowish expectoration increasing shortness of breath poor appetite patient was seen by primary care physician was treated with antibiotic and prednisone therapy without much relief.  Family wanted me to reevaluate this patient because of progressive decline of condition. History of suspected lung cancer for which patient received radiation therapy No nausea no vomiting poor appetite patient is in wheelchair.  With the meted ambulation.  Patient does not smoke.  No chills or fever reported  Patient is here for ongoing evaluation and treatment consideration .  Had a PET scan done. According to family after  antibiotics were given patient's   Condition improved  Appetite is better Cough is better Patient and family is here to discuss result of the PET scan  REVIEW OF SYSTEMS:   Gen. status: Declining performance status.  HEENT denies any headache.  No visual disturbances.  No difficulty in swallowing.  No soreness in the mouth. Lungs: Increasing shortness of breath.  Increasing cough. GI: Poor appetite and nausea no vomiting no rectal bleeding GU: No dysuria hematuria Skin: No rash Musculoskeletal system  diffuse bony pain Neurological system: Patient is alert oriented without any focal signs All other 12 systems have been reviewed As per HPI. Otherwise, a complete review of systems is negatve.  Pignificant History/PMH:   benign prostatic hypertrophy w/elev. psa level:    CVA:    stoke:    occluded right iliac artery:    HTN:    hyperlipids:    CAD:    copd:    aneurysm, aortic:    Femoral-popliteal bypass graft:    daily drinker (one mixed drink):    smoker one pack a day:   Preventive Screening:   Has patient had any of the following test? Prostate Exam (1)    Last Prostate Exam: almost a year ago(1)   Smoking History: Smoking History Never Smoked.(1)  PFSH:  Comments: Family history of coronary artery disease,No family history of colorectal cancer, breast cancer, or ovarian cancer.   Comments: Does smoke for several years and continues to smoke, does drink regularly   Additional Past Medical and Surgical History: Peripheral vascular disease.    History of cerebrovascular disease.    Atrial fibrillation.    Emphysema chronic obstructive pulmonary disease.    Coronary artery disease include left ventricular ejection fraction of 55%   ADVANCED DIRECTIVES:  Patient does have advance healthcare directive, Patient   does not desire to make any changes HEALTH MAINTENANCE: Social History  Substance Use Topics  . Smoking status: Former Research scientist (life sciences)  . Smokeless tobacco: None  . Alcohol Use: None      Allergies  Allergen Reactions  . Donepezil Other (See Comments)    Current Outpatient Prescriptions  Medication Sig  Dispense Refill  . aspirin EC 81 MG tablet Take by mouth.    . budesonide-formoterol (SYMBICORT) 160-4.5 MCG/ACT inhaler INHALE 2 PUFFS BY MOUTH TWICE DAILY AS DIRECTED    . cyanocobalamin (,VITAMIN B-12,) 1000 MCG/ML injection Inject into the muscle.    . pravastatin (PRAVACHOL) 40 MG tablet TAKE 1 TABLET BY MOUTH EVERY DAY    . tiotropium (SPIRIVA  HANDIHALER) 18 MCG inhalation capsule INHALE 1 CAPSULE BY MOUTH VIA HANDIHALER EVERY DAY    . tobramycin (TOBREX) 0.3 % ophthalmic solution Apply to eye.     No current facility-administered medications for this visit.  Significant History/PMH:   benign prostatic hypertrophy w/elev. psa level:    CVA:    stoke:    occluded right iliac artery:    HTN:    hyperlipids:    CAD:    copd:    aneurysm, aortic:    Femoral-popliteal bypass graft:    daily drinker (one mixed drink):    smoker one pack a day:   Preventive Screening:   Has patient had any of the following test? Prostate Exam (1)    Last Prostate Exam: almost a year ago(1)   Smoking History: Smoking History Never Smoked.(1)  PFSH:  Comments: Family history of coronary artery disease,No family history of colorectal cancer, breast cancer, or ovarian cancer.   Comments: Does smoke for several years and continues to smoke, does drink regularly   Additional Past Medical and Surgical History: Peripheral vascular disease.    History of cerebrovascular disease.    Atrial fibrillation.    Emphysema chronic obstructive pulmonary disease.    Coronary artery disease include left ventricular ejection fraction of 55%    OBJECTIVE:  Filed Vitals:   09/02/15 1524  BP: 102/70  Pulse: 82  Temp: 96.2 F (35.7 C)  Resp: 18     There is no height on file to calculate BMI.    ECOG FS:2 - Symptomatic, <50% confined to bed  PHYSICAL EXAM: General status: Patient is alert oriented in the wheelchair.  Performance status is poor Lungs: Emphysematous chest.  Crepitation in the left base. Cardiac: Tachycardia .  Soft systolic murmur Abdominal exam revealed normal bowel sounds. The abdomen was soft, non-tender, and without masses, organomegaly, or appreciable enlargement of the abdominal aorta Examination of the skin revealed no evidence of significant rashes, suspicious appearing nevi or other concerning lesions. Neurological  system is difficult to examine but no other focal signs ambulation is difficult Musculoskeletal system no bony fracture or course joint swelling Extremity no swelling Head exam was generally normal. There was no scleral icterus or corneal arcus. Mucous membranes were moist. All other systems have been examining LAB RESUL.  .TS:  CBC Latest Ref Rng 08/26/2015 02/11/2013  WBC 3.8 - 10.6 K/uL 11.4(H) 9.2  Hemoglobin 13.0 - 18.0 g/dL 15.5 13.4  Hematocrit 40.0 - 52.0 % 47.3 39.7(L)  Platelets 150 - 440 K/uL 255 142(L)    Hospital Outpatient Visit on 08/31/2015  Component Date Value Ref Range Status  . Glucose-Capillary 08/31/2015 97  65 - 99 mg/dL Final       STUDIES:  restriction was done in primary care's office and I do not have any result available for may need to review    ASSESSMENT: Carcinoma of lung at present time possibility of recurrent or progressing disease cannot be ruled out   multiple other comorbid condition might be reason why this elderly gentleman's overall condition has been declining.  MEDICAL DECISION MAking PET  scan  has been reviewed independently and with family. Shows progressive disease I had prolonged  discussion with family Regarding options of therapy More radiation Immunotherapy Observation Considering Patient old age and multiple other  comorbidities condition It would be better  for patient's quality of life  To continue observation, Treatment patient's symptoms with antibiotics and steroid if needed.Duration of visit is 25 minutes and 50% of time was spent discussing VARIOUS  options or alleviating care with other physicians social worker    ient expressed understanding and was in agreement with this plan. He also understands that He can call clinic at any time with any questions, concerns, or complaints.    No matching staging information was found for the patient.  Forest Gleason, MD   09/02/2015 3:33 PM

## 2015-09-02 NOTE — Progress Notes (Signed)
Patient here for PET results.

## 2015-10-08 DIAGNOSIS — J42 Unspecified chronic bronchitis: Secondary | ICD-10-CM | POA: Diagnosis not present

## 2015-10-08 DIAGNOSIS — C3490 Malignant neoplasm of unspecified part of unspecified bronchus or lung: Secondary | ICD-10-CM | POA: Diagnosis not present

## 2015-10-20 DIAGNOSIS — H35422 Microcystoid degeneration of retina, left eye: Secondary | ICD-10-CM | POA: Diagnosis not present

## 2015-10-20 DIAGNOSIS — H353223 Exudative age-related macular degeneration, left eye, with inactive scar: Secondary | ICD-10-CM | POA: Diagnosis not present

## 2015-10-20 DIAGNOSIS — H353211 Exudative age-related macular degeneration, right eye, with active choroidal neovascularization: Secondary | ICD-10-CM | POA: Diagnosis not present

## 2015-10-20 DIAGNOSIS — H4321 Crystalline deposits in vitreous body, right eye: Secondary | ICD-10-CM | POA: Diagnosis not present

## 2015-11-04 ENCOUNTER — Emergency Department
Admission: EM | Admit: 2015-11-04 | Discharge: 2015-11-04 | Disposition: A | Discharge status: Home / Self Care | Payer: PPO | Attending: Emergency Medicine | Admitting: Emergency Medicine

## 2015-11-04 ENCOUNTER — Encounter: Payer: Self-pay | Admitting: *Deleted

## 2015-11-04 DIAGNOSIS — Z79899 Other long term (current) drug therapy: Secondary | ICD-10-CM | POA: Diagnosis not present

## 2015-11-04 DIAGNOSIS — Z87891 Personal history of nicotine dependence: Secondary | ICD-10-CM | POA: Diagnosis not present

## 2015-11-04 DIAGNOSIS — Z85118 Personal history of other malignant neoplasm of bronchus and lung: Secondary | ICD-10-CM | POA: Insufficient documentation

## 2015-11-04 DIAGNOSIS — E785 Hyperlipidemia, unspecified: Secondary | ICD-10-CM | POA: Insufficient documentation

## 2015-11-04 DIAGNOSIS — Z7982 Long term (current) use of aspirin: Secondary | ICD-10-CM | POA: Insufficient documentation

## 2015-11-04 DIAGNOSIS — Z7951 Long term (current) use of inhaled steroids: Secondary | ICD-10-CM | POA: Diagnosis not present

## 2015-11-04 DIAGNOSIS — Z950 Presence of cardiac pacemaker: Secondary | ICD-10-CM | POA: Diagnosis not present

## 2015-11-04 DIAGNOSIS — G301 Alzheimer's disease with late onset: Secondary | ICD-10-CM | POA: Insufficient documentation

## 2015-11-04 DIAGNOSIS — J449 Chronic obstructive pulmonary disease, unspecified: Secondary | ICD-10-CM | POA: Insufficient documentation

## 2015-11-04 DIAGNOSIS — C3490 Malignant neoplasm of unspecified part of unspecified bronchus or lung: Secondary | ICD-10-CM | POA: Diagnosis not present

## 2015-11-04 DIAGNOSIS — R339 Retention of urine, unspecified: Secondary | ICD-10-CM | POA: Insufficient documentation

## 2015-11-04 LAB — URINALYSIS COMPLETE WITH MICROSCOPIC (ARMC ONLY)
BACTERIA UA: NONE SEEN
BILIRUBIN URINE: NEGATIVE
Glucose, UA: NEGATIVE mg/dL
HGB URINE DIPSTICK: NEGATIVE
KETONES UR: NEGATIVE mg/dL
LEUKOCYTES UA: NEGATIVE
NITRITE: NEGATIVE
PH: 5 (ref 5.0–8.0)
PROTEIN: NEGATIVE mg/dL
SPECIFIC GRAVITY, URINE: 1.012 (ref 1.005–1.030)

## 2015-11-04 NOTE — ED Notes (Signed)
Urine emptied, foley catheter clamped.  Patient is AAOx3.  Skin warm and dry.  NAD.  Continue to monitor.

## 2015-11-04 NOTE — ED Provider Notes (Signed)
[facility] Emergency Department Provider Note   ____________________________________________  Time seen: ~1520  I have reviewed the triage vital signs and the nursing notes.   HISTORY  Chief Complaint Urinary Retention   History limited by: Not Limited   HPI Johnathan Dunn is a 82 y.o. male who presents to the emergency department today because of concerns for urinary retention. He states that he has not urinated for the past 2 days. He had not noticed any change in his urine prior to the urinary retention. He denied any significant abdominal pain although stated he had a little abdominal discomfort. No recent fevers. No nausea or vomiting. No numbness or tingling in his legs.     History reviewed. No pertinent past medical history.  Patient Active Problem List   Diagnosis Date Noted  . Alzheimer's dementia, late onset 11/26/2014  . Cancer of lung ([place]) 12/29/2013  . Addison anemia 12/29/2013  . Breathlessness on exertion 12/26/2013  . Chronic obstructive pulmonary disease ([place]) 11/02/2013  . HLD (hyperlipidemia) 11/02/2013  . Artificial cardiac pacemaker 02/10/2013    History reviewed. No pertinent past surgical history.  Current Outpatient Rx  Name  Route  Sig  Dispense  Refill  . aspirin EC 81 MG tablet   Oral   Take by mouth.         . budesonide-formoterol (SYMBICORT) 160-4.5 MCG/ACT inhaler      INHALE 2 PUFFS BY MOUTH TWICE DAILY AS DIRECTED         . cyanocobalamin (,VITAMIN B-12,) 1000 MCG/ML injection   Intramuscular   Inject into the muscle.         . pravastatin (PRAVACHOL) 40 MG tablet      TAKE 1 TABLET BY MOUTH EVERY DAY         . tiotropium (SPIRIVA HANDIHALER) 18 MCG inhalation capsule      INHALE 1 CAPSULE BY MOUTH VIA HANDIHALER EVERY DAY         . tobramycin (TOBREX) 0.3 % ophthalmic solution   Ophthalmic   Apply to eye.           Allergies Donepezil  No family history on file.  Social  History Social History  Substance Use Topics  . Smoking status: Former Research scientist (life sciences)  . Smokeless tobacco: None  . Alcohol Use: No    Review of Systems  Constitutional: Negative for fever. Cardiovascular: Negative for chest pain. Respiratory: Negative for shortness of breath. Gastrointestinal: Negative for abdominal pain, vomiting and diarrhea. Genitourinary: Positive for urinary retention. Musculoskeletal: Negative for back pain. Skin: Negative for rash. Neurological: Negative for headaches, focal weakness or numbness.   10-point ROS otherwise negative.  ____________________________________________   PHYSICAL EXAM:  VITAL SIGNS: ED Triage Vitals  Enc Vitals Group     BP 11/04/15 1421 86/57 mmHg     Pulse Rate 11/04/15 1421 72     Resp 11/04/15 1421 14     Temp 11/04/15 1421 97.4 F (36.3 C)     Temp Source 11/04/15 1421 Oral     SpO2 11/04/15 1421 95 %     Weight 11/04/15 1421 144 lb (65.318 kg)     Height 11/04/15 1421 '5\' 8"'$  (1.727 m)  Exam occurred after Foley placement. Constitutional: Alert and oriented. Well appearing and in no distress. Eyes: Conjunctivae are normal. PERRL. Normal extraocular movements. ENT   Head: Normocephalic and atraumatic.   Nose: No congestion/rhinnorhea.   Mouth/Throat: Mucous membranes are moist.   Neck: No stridor. Hematological/Lymphatic/Immunilogical: No  cervical lymphadenopathy. Cardiovascular: Normal rate, regular rhythm.  No murmurs, rubs, or gallops. Respiratory: Normal respiratory effort without tachypnea nor retractions. Breath sounds are clear and equal bilaterally. No wheezes/rales/rhonchi. Gastrointestinal: Soft and nontender. No distention.  Genitourinary: Prostate exam showed a diffusely enlarged, nontender, nonnodular prostate. Good rectal tone. Musculoskeletal: Normal range of motion in all extremities. No joint effusions.  No lower extremity tenderness nor edema. Neurologic:  Normal speech and language. No  gross focal neurologic deficits are appreciated.  Skin:  Skin is warm, dry and intact. No rash noted. Psychiatric: Mood and affect are normal. Speech and behavior are normal. Patient exhibits appropriate insight and judgment.  ____________________________________________    LABS (pertinent positives/negatives)  Labs Reviewed  URINALYSIS COMPLETEWITH MICROSCOPIC ([place] ONLY) - Abnormal; Notable for the following:    Color, Urine YELLOW (*)    APPearance CLEAR (*)    Squamous Epithelial / LPF 0-5 (*)    All other components within normal limits    ____________________________________________   EKG  None  ____________________________________________    RADIOLOGY  None  ____________________________________________   PROCEDURES  Procedure(s) performed: None  Critical Care performed: No  ____________________________________________   INITIAL IMPRESSION / ASSESSMENT AND PLAN / ED COURSE  Pertinent labs & imaging results that were available during my care of the patient were reviewed by me and considered in my medical decision making (see chart for details).  Patient presented to the emergency department today because of concerns for urinary retention. Prostate exam showed a diffusely enlarged nontender, non-nodular prostate. This point I think BPH likely the cause of the acute urinary retention. Urine did not show any signs of infection. No other concerning findings for spinal cord lesion. Will discharge patient with Foley catheter and have him follow-up with urology.  ____________________________________________   FINAL CLINICAL IMPRESSION(S) / ED DIAGNOSES  Final diagnoses:  Urinary retention     Nance Pear, MD 11/04/15 1531

## 2015-11-04 NOTE — ED Notes (Signed)
AAOx3.  Skin warm and dry.  NAD.  D/C home

## 2015-11-04 NOTE — ED Notes (Signed)
Pt is unable to void for two days,. Pt denies any other problems

## 2015-11-04 NOTE — ED Notes (Signed)
Bladder was scanned. Resulted with 999 mls.

## 2015-11-04 NOTE — Discharge Instructions (Signed)
Please seek medical attention for any high fevers, chest pain, shortness of breath, change in behavior, persistent vomiting, bloody stool or any other new or concerning symptoms.   Acute Urinary Retention, Male Acute urinary retention is the temporary inability to urinate. This is a common problem in older men. As men age their prostates become larger and block the flow of urine from the bladder. This is usually a problem that has come on gradually.  HOME CARE INSTRUCTIONS If you are sent home with a Foley catheter and a drainage system, you will need to discuss the best course of action with your health care provider. While the catheter is in, maintain a good intake of fluids. Keep the drainage bag emptied and lower than your catheter. This is so that contaminated urine will not flow back into your bladder, which could lead to a urinary tract infection. There are two main types of drainage bags. One is a large bag that usually is used at night. It has a good capacity that will allow you to sleep through the night without having to empty it. The second type is called a leg bag. It has a smaller capacity, so it needs to be emptied more frequently. However, the main advantage is that it can be attached by a leg strap and can go underneath your clothing, allowing you the freedom to move about or leave your home. Only take over-the-counter or prescription medicines for pain, discomfort, or fever as directed by your health care provider.  SEEK MEDICAL CARE IF:  You develop a low-grade fever.  You experience spasms or leakage of urine with the spasms. SEEK IMMEDIATE MEDICAL CARE IF:   You develop chills or fever.  Your catheter stops draining urine.  Your catheter falls out.  You start to develop increased bleeding that does not respond to rest and increased fluid intake. MAKE SURE YOU:  Understand these instructions.  Will watch your condition.  Will get help right away if you are not doing  well or get worse.   This information is not intended to replace advice given to you by your health care provider. Make sure you discuss any questions you have with your health care provider.   Document Released: 10/31/2000 Document Revised: 12/09/2014 Document Reviewed: 01/03/2013 Elsevier Interactive Patient Education [employer].

## 2015-11-10 DIAGNOSIS — N410 Acute prostatitis: Secondary | ICD-10-CM | POA: Diagnosis not present

## 2015-11-10 DIAGNOSIS — J42 Unspecified chronic bronchitis: Secondary | ICD-10-CM | POA: Diagnosis not present

## 2015-11-11 ENCOUNTER — Encounter: Payer: Self-pay | Admitting: Urology

## 2015-11-11 ENCOUNTER — Ambulatory Visit (INDEPENDENT_AMBULATORY_CARE_PROVIDER_SITE_OTHER): Payer: PPO | Admitting: Urology

## 2015-11-11 VITALS — BP 105/63 | HR 93 | Ht 68.0 in | Wt 142.5 lb

## 2015-11-11 DIAGNOSIS — N400 Enlarged prostate without lower urinary tract symptoms: Secondary | ICD-10-CM

## 2015-11-11 DIAGNOSIS — R338 Other retention of urine: Principal | ICD-10-CM

## 2015-11-11 DIAGNOSIS — N401 Enlarged prostate with lower urinary tract symptoms: Secondary | ICD-10-CM | POA: Insufficient documentation

## 2015-11-11 DIAGNOSIS — N481 Balanitis: Secondary | ICD-10-CM | POA: Diagnosis not present

## 2015-11-11 MED ORDER — FINASTERIDE 5 MG PO TABS: 5.0000 mg | ORAL_TABLET | Freq: Every day | ORAL | Status: DC

## 2015-11-11 MED ORDER — CLOTRIMAZOLE-BETAMETHASONE 1-0.05 % EX CREA: 1.0000 "application " | TOPICAL_CREAM | Freq: Two times a day (BID) | CUTANEOUS | Status: DC

## 2015-11-11 MED ORDER — TAMSULOSIN HCL 0.4 MG PO CAPS: 0.4000 mg | ORAL_CAPSULE | Freq: Every day | ORAL | Status: AC

## 2015-11-11 NOTE — Progress Notes (Signed)
11/11/2015 9:38 AM   Paulina Fusi November 08, 1922 [id number]  Referring provider: Kirk Ruths, MD [facility] [place], [place] [address]  Chief Complaint  Patient presents with  . Urinary Retention    referred by ER    HPI: Patient is a 82 year old Caucasian male who had an incident of acute urinary retention who is referred to [place] by [facility] emergency department for further evaluation and management.  Patient is a poor historian and presents today with his son-in-law, Ronalee Belts.   Neither, the patient or the son-in-law couldn't give a urinary history prior to the episode of urinary retention.  Patient states he was not having difficulty until he couldn't urinate for 2 days.  He was then he was carried to the emergency room and a Foley catheter was placed.  Greater than 1 L of urine was returned.    He has not had any episodes of gross hematuria, dysuria or suprapubic pain.  He does not have a history of recurrent urinary tract infections or nephrolithiasis.  He has not had any recent fevers, chills, nausea or vomiting.     PMH: Past Medical History  Diagnosis Date  . Urinary retention   . Lung cancer ([place])   . Glaucoma     Surgical History: Past Surgical History  Procedure Laterality Date  . Pacemaker insertion    . Stomach surgery      patient describes something put in his stomach to help blood flow to legs    Home Medications:    Medication List       This list is accurate as of: 11/11/15  9:38 AM.  Always use your most recent med list.               aspirin EC 81 MG tablet  Take by mouth.     azithromycin 500 MG tablet  Commonly known as:  ZITHROMAX  Reported on 11/11/2015     ciprofloxacin 500 MG tablet  Commonly known as:  CIPRO  TK 1 T PO  BID     clotrimazole-betamethasone cream  Commonly known as:  LOTRISONE  Apply 1 application topically 2 (two) times daily.     cyanocobalamin 1000 MCG/ML injection  Commonly  known as:  (VITAMIN B-12)  Inject into the muscle. Reported on 11/11/2015     finasteride 5 MG tablet  Commonly known as:  PROSCAR  Take 1 tablet (5 mg total) by mouth daily.     pravastatin 40 MG tablet  Commonly known as:  PRAVACHOL  TAKE 1 TABLET BY MOUTH EVERY DAY     predniSONE 20 MG tablet  Commonly known as:  DELTASONE  Reported on 11/11/2015     SPIRIVA HANDIHALER 18 MCG inhalation capsule  Generic drug:  tiotropium  INHALE 1 CAPSULE BY MOUTH VIA HANDIHALER EVERY DAY     SYMBICORT 160-4.5 MCG/ACT inhaler  Generic drug:  budesonide-formoterol  INHALE 2 PUFFS BY MOUTH TWICE DAILY AS DIRECTED     tamsulosin 0.4 MG Caps capsule  Commonly known as:  FLOMAX  Take 1 capsule (0.4 mg total) by mouth daily.     tobramycin 0.3 % ophthalmic solution  Commonly known as:  TOBREX  Apply to eye. Reported on 11/11/2015        Allergies:  Allergies  Allergen Reactions  . Donepezil Other (See Comments)    Family History: Family History  Problem Relation Age of Onset  . Kidney disease Neg Hx   .  Prostate cancer Neg Hx     Social History:  reports that he has quit smoking. He does not have any smokeless tobacco history on file. He reports that he does not drink alcohol or use illicit drugs.  ROS: UROLOGY Frequent Urination?: No Hard to postpone urination?: No Burning/pain with urination?: No Get up at night to urinate?: No Leakage of urine?: No Urine stream starts and stops?: No Trouble starting stream?: Yes Do you have to strain to urinate?: No Blood in urine?: No Urinary tract infection?: No Sexually transmitted disease?: No Injury to kidneys or bladder?: No Painful intercourse?: No Weak stream?: No Erection problems?: No Penile pain?: No  Gastrointestinal Nausea?: No Vomiting?: No Indigestion/heartburn?: No Diarrhea?: No Constipation?: No  Constitutional Fever: No Night sweats?: No Weight loss?: No Fatigue?: No  Skin Skin rash/lesions?: No Itching?:  No  Eyes Blurred vision?: No Double vision?: No  Ears/Nose/Throat Sore throat?: No Sinus problems?: No  Hematologic/Lymphatic Swollen glands?: No Easy bruising?: No  Cardiovascular Leg swelling?: No Chest pain?: No  Respiratory Cough?: No Shortness of breath?: No  Endocrine Excessive thirst?: No  Musculoskeletal Back pain?: No Joint pain?: No  Neurological Headaches?: No Dizziness?: No  Psychologic Depression?: No Anxiety?: No  Physical Exam: BP 105/63 mmHg  Pulse 93  Ht '5\' 8"'$  (1.727 m)  Wt 142 lb 8 oz (64.638 kg)  BMI 21.67 kg/m2  Constitutional: Well nourished. Alert and oriented, No acute distress. HEENT: [place] AT, moist mucus membranes. Trachea midline, no masses. Cardiovascular: No clubbing, cyanosis, or edema. Respiratory: Normal respiratory effort, no increased work of breathing. GI: Abdomen is soft, non tender, non distended, no abdominal masses. Liver and spleen not palpable.  No hernias appreciated.  Stool sample for occult testing is not indicated.   GU: No CVA tenderness.  No bladder fullness or masses.  Patient with uncircumcised phallus. Foreskin could not be retracted, it is cracked and edematous.   Urethral meatus is patent.  No penile discharge. No penile lesions or rashes. Scrotum without lesions, cysts, rashes and/or edema.  Testicles are located scrotally bilaterally. No masses are appreciated in the testicles. Left and right epididymis are normal. Rectal: Patient with  normal sphincter tone. Anus and perineum without scarring or rashes. No rectal masses are appreciated. Prostate is approximately 70 grams, no nodules are appreciated. Seminal vesicles are normal. Skin: No rashes, bruises or suspicious lesions. Lymph: No cervical or inguinal adenopathy. Neurologic: Grossly intact, no focal deficits, moving all 4 extremities. Psychiatric: Normal mood and affect.  Laboratory Data: Lab Results  Component Value Date   WBC 11.4* 08/26/2015   HGB  15.5 08/26/2015   HCT 47.3 08/26/2015   MCV 85.7 08/26/2015   PLT 255 08/26/2015    Lab Results  Component Value Date   CREATININE 0.99 08/26/2015    Lab Results  Component Value Date   TSH 1.23 02/10/2013    Lab Results  Component Value Date   AST 16 08/26/2015   Lab Results  Component Value Date   ALT 11* 08/26/2015     Assessment & Plan:    1. BPH with retention:   Foley discontinued.  Voiding trial initiated today.  If successful today, RTC in one month for IPSS score and PVR.  Tamsulosin 0.4 mg and finasteride 5 mg started today.    2. Balanitis:   Clotrimazole-nystatin cream started today.  Recheck when patient RTC in one month.    Return in about 1 month (around 12/11/2015) for IPSS score, PVR and exam.  These notes generated with voice recognition software. I apologize for typographical errors.  Zara Council, [facility] [address], [place], [place] [address] [phone]

## 2015-11-11 NOTE — Progress Notes (Signed)
Catheter Removal  Patient is present today for a catheter removal.  32m of water was drained from the balloon. A 16 FR Coude foley cath was removed from the bladder no complications were noted . Patient tolerated well.  Preformed by: RLyndee HensenCMA  Follow up/ Additional notes: Patient instructed to drink fluids and if he can not void on his own by 3 pm to come back to office. Patient and son understands.

## 2015-11-24 DIAGNOSIS — D510 Vitamin B12 deficiency anemia due to intrinsic factor deficiency: Secondary | ICD-10-CM | POA: Diagnosis not present

## 2015-11-24 DIAGNOSIS — E7800 Pure hypercholesterolemia, unspecified: Secondary | ICD-10-CM | POA: Diagnosis not present

## 2015-11-24 DIAGNOSIS — J42 Unspecified chronic bronchitis: Secondary | ICD-10-CM | POA: Diagnosis not present

## 2015-11-24 DIAGNOSIS — I739 Peripheral vascular disease, unspecified: Secondary | ICD-10-CM | POA: Diagnosis not present

## 2015-12-01 DIAGNOSIS — G301 Alzheimer's disease with late onset: Secondary | ICD-10-CM | POA: Diagnosis not present

## 2015-12-01 DIAGNOSIS — J42 Unspecified chronic bronchitis: Secondary | ICD-10-CM | POA: Diagnosis not present

## 2015-12-01 DIAGNOSIS — I739 Peripheral vascular disease, unspecified: Secondary | ICD-10-CM | POA: Diagnosis not present

## 2015-12-01 DIAGNOSIS — F0281 Dementia in other diseases classified elsewhere with behavioral disturbance: Secondary | ICD-10-CM | POA: Diagnosis not present

## 2015-12-01 DIAGNOSIS — E7800 Pure hypercholesterolemia, unspecified: Secondary | ICD-10-CM | POA: Diagnosis not present

## 2015-12-01 DIAGNOSIS — N401 Enlarged prostate with lower urinary tract symptoms: Secondary | ICD-10-CM

## 2015-12-01 DIAGNOSIS — N138 Other obstructive and reflux uropathy: Secondary | ICD-10-CM | POA: Insufficient documentation

## 2015-12-16 ENCOUNTER — Ambulatory Visit: Payer: PPO | Admitting: Urology

## 2015-12-17 ENCOUNTER — Encounter: Payer: Self-pay | Admitting: Urology

## 2015-12-17 ENCOUNTER — Ambulatory Visit (INDEPENDENT_AMBULATORY_CARE_PROVIDER_SITE_OTHER): Payer: PPO | Admitting: Urology

## 2015-12-17 VITALS — BP 114/68 | HR 82 | Ht 66.0 in | Wt 142.0 lb

## 2015-12-17 DIAGNOSIS — N400 Enlarged prostate without lower urinary tract symptoms: Secondary | ICD-10-CM

## 2015-12-17 DIAGNOSIS — N401 Enlarged prostate with lower urinary tract symptoms: Secondary | ICD-10-CM

## 2015-12-17 DIAGNOSIS — R338 Other retention of urine: Principal | ICD-10-CM

## 2015-12-17 LAB — BLADDER SCAN AMB NON-IMAGING

## 2015-12-17 NOTE — Progress Notes (Signed)
10:12 AM   Johnathan Dunn 06/20/1923 [id number]  Referring provider: Kirk Ruths, MD [facility] [place], [place] [address]  Chief Complaint  Patient presents with  . Benign Prostatic Hypertrophy    61month   HPI: Patient is a 82year old Caucasian male who presents today for a 1 month follow-up after undergoing a voiding trial for urinary retention.  Background history Patient had an incident of acute urinary [facility].  Patient is a poor historian and presented  with his son-in-law, MRonalee Belts   Neither, the patient or the son-in-law couldn't give a urinary history prior to the episode of urinary retention.  Patient states he was not having difficulty until he couldn't urinate for 2 days.  He was then he was carried to the emergency room and a Foley catheter was placed.  Greater than 1 L of urine was returned.  One week later, he underwent a voiding trial and successfully started passing urine.  Today, he is not having complaints.  His PVR is 192 mL.  He has not had any episodes of gross hematuria, dysuria or suprapubic pain.   He has not had any recent fevers, chills, nausea or vomiting.  He is continuing the tamsulosin and finasteride as prescribed.     PMH: Past Medical History  Diagnosis Date  . Urinary retention   . [place]   . Glaucoma     Surgical History: Past Surgical History  Procedure Laterality Date  . Pacemaker insertion    . Stomach surgery      patient describes something put in his stomach to help blood flow to legs    Home Medications:    Medication List       This list is accurate as of: 12/17/15 10:12 AM.  Always use your most recent med list.               aspirin EC 81 MG tablet  Take by mouth.     azithromycin 500 MG tablet  Commonly known as:  ZITHROMAX  Reported on 11/11/2015     clotrimazole-betamethasone cream  Commonly known as:   LOTRISONE  Apply 1 application topically 2 (two) times daily.     cyanocobalamin 1000 MCG/ML injection  Commonly known as:  (VITAMIN B-12)  Inject into the muscle. Reported on 11/11/2015     finasteride 5 MG tablet  Commonly known as:  PROSCAR  Take 1 tablet (5 mg total) by mouth daily.     pravastatin 40 MG tablet  Commonly known as:  PRAVACHOL  TAKE 1 TABLET BY MOUTH EVERY DAY     predniSONE 20 MG tablet  Commonly known as:  DELTASONE  Reported on 11/11/2015     SPIRIVA HANDIHALER 18 MCG inhalation capsule  Generic drug:  tiotropium  INHALE 1 CAPSULE BY MOUTH VIA HANDIHALER EVERY DAY     SYMBICORT 160-4.5 MCG/ACT inhaler  Generic drug:  budesonide-formoterol  INHALE 2 PUFFS BY MOUTH TWICE DAILY AS DIRECTED     tamsulosin 0.4 MG Caps capsule  Commonly known as:  FLOMAX  Take 1 capsule (0.4 mg total) by mouth daily.     tobramycin 0.3 % ophthalmic solution  Commonly known as:  TOBREX  Apply to eye. Reported on 11/11/2015        Allergies:  Allergies  Allergen Reactions  . Donepezil Other (See Comments)    Family History: Family  History  Problem Relation Age of Onset  . Kidney disease Neg Hx   . Prostate cancer Neg Hx     Social History:  reports that he has quit smoking. He does not have any smokeless tobacco history on file. He reports that he does not drink alcohol or use illicit drugs.  ROS: UROLOGY Frequent Urination?: No Hard to postpone urination?: No Burning/pain with urination?: No Get up at night to urinate?: No Leakage of urine?: No Urine stream starts and stops?: No Trouble starting stream?: No Do you have to strain to urinate?: No Blood in urine?: No Urinary tract infection?: No Sexually transmitted disease?: No Injury to kidneys or bladder?: No Painful intercourse?: No Weak stream?: No Erection problems?: No Penile pain?: No  Gastrointestinal Nausea?: No Vomiting?: No Indigestion/heartburn?: No Diarrhea?: No Constipation?:  No  Constitutional Fever: No Night sweats?: No Weight loss?: Yes Fatigue?: Yes  Skin Skin rash/lesions?: No Itching?: Yes  Eyes Blurred vision?: No Double vision?: No  Ears/Nose/Throat Sore throat?: No Sinus problems?: No  Hematologic/Lymphatic Swollen glands?: No Easy bruising?: No  Cardiovascular Leg swelling?: No Chest pain?: No  Respiratory Cough?: No Shortness of breath?: Yes  Endocrine Excessive thirst?: No  Musculoskeletal Back pain?: No Joint pain?: No  Neurological Headaches?: No Dizziness?: No  Psychologic Depression?: No Anxiety?: No  Physical Exam: BP 114/68 mmHg  Pulse 82  Ht '5\' 6"'$  (1.676 m)  Wt 142 lb (64.411 kg)  BMI 22.93 kg/m2  Constitutional: Well nourished. Alert and oriented, No acute distress. HEENT: [place] AT, moist mucus membranes. Trachea midline, no masses. Cardiovascular: No clubbing, cyanosis, or edema. Respiratory: Normal respiratory effort, no increased work of breathing. GI: Abdomen is soft, non tender, non distended, no abdominal masses. Liver and spleen not palpable.  No hernias appreciated.  Stool sample for occult testing is not indicated.   GU: No CVA tenderness.  No bladder fullness or masses.  Patient with uncircumcised phallus. Foreskin could not be retracted.   Urethral meatus is patent.  No penile discharge. No penile lesions or rashes. Scrotum without lesions, cysts, rashes and/or edema.  Testicles are located scrotally bilaterally. No masses are appreciated in the testicles. Left and right epididymis are normal. Skin: No rashes, bruises or suspicious lesions. Lymph: No cervical or inguinal adenopathy. Neurologic: Grossly intact, no focal deficits, moving all 4 extremities. Psychiatric: Normal mood and affect.  Laboratory Data: Lab Results  Component Value Date   WBC 11.4* 08/26/2015   HGB 15.5 08/26/2015   HCT 47.3 08/26/2015   MCV 85.7 08/26/2015   PLT 255 08/26/2015    Lab Results  Component Value  Date   CREATININE 0.99 08/26/2015    Lab Results  Component Value Date   TSH 1.23 02/10/2013    Lab Results  Component Value Date   AST 16 08/26/2015   Lab Results  Component Value Date   ALT 11* 08/26/2015     Assessment & Plan:    1. BPH with retention:    Passed voiding trial.   PVR 192 mL.  RTC in 3 month for IPSS score and PVR.  Continue tamsulosin 0.4 mg and finasteride 5 mg.    2. Balanitis:   Resolved.      Return in about 3 months (around 03/18/2016) for IPSS, PVR and exam.  These notes generated with voice recognition software. I apologize for typographical errors.  Zara Council, [facility] [address], [place], [place] [address] [phone]

## 2015-12-24 DIAGNOSIS — I442 Atrioventricular block, complete: Secondary | ICD-10-CM | POA: Diagnosis not present

## 2016-02-12 DIAGNOSIS — Z950 Presence of cardiac pacemaker: Secondary | ICD-10-CM | POA: Diagnosis not present

## 2016-02-12 DIAGNOSIS — G301 Alzheimer's disease with late onset: Secondary | ICD-10-CM | POA: Diagnosis not present

## 2016-02-12 DIAGNOSIS — R0602 Shortness of breath: Secondary | ICD-10-CM | POA: Diagnosis not present

## 2016-02-12 DIAGNOSIS — E7800 Pure hypercholesterolemia, unspecified: Secondary | ICD-10-CM | POA: Diagnosis not present

## 2016-02-12 DIAGNOSIS — F0281 Dementia in other diseases classified elsewhere with behavioral disturbance: Secondary | ICD-10-CM | POA: Diagnosis not present

## 2016-02-16 DIAGNOSIS — H353123 Nonexudative age-related macular degeneration, left eye, advanced atrophic without subfoveal involvement: Secondary | ICD-10-CM | POA: Diagnosis not present

## 2016-02-16 DIAGNOSIS — H43813 Vitreous degeneration, bilateral: Secondary | ICD-10-CM | POA: Diagnosis not present

## 2016-02-16 DIAGNOSIS — H353211 Exudative age-related macular degeneration, right eye, with active choroidal neovascularization: Secondary | ICD-10-CM | POA: Diagnosis not present

## 2016-03-01 ENCOUNTER — Inpatient Hospital Stay (HOSPITAL_BASED_OUTPATIENT_CLINIC_OR_DEPARTMENT_OTHER): Payer: PPO | Admitting: Oncology

## 2016-03-01 ENCOUNTER — Inpatient Hospital Stay: Payer: PPO | Attending: Oncology

## 2016-03-01 VITALS — BP 114/74 | HR 69 | Wt 140.0 lb

## 2016-03-01 DIAGNOSIS — Z923 Personal history of irradiation: Secondary | ICD-10-CM

## 2016-03-01 DIAGNOSIS — F17210 Nicotine dependence, cigarettes, uncomplicated: Secondary | ICD-10-CM | POA: Diagnosis not present

## 2016-03-01 DIAGNOSIS — C3490 Malignant neoplasm of unspecified part of unspecified bronchus or lung: Secondary | ICD-10-CM

## 2016-03-01 DIAGNOSIS — I2510 Atherosclerotic heart disease of native coronary artery without angina pectoris: Secondary | ICD-10-CM | POA: Diagnosis not present

## 2016-03-01 DIAGNOSIS — J449 Chronic obstructive pulmonary disease, unspecified: Secondary | ICD-10-CM | POA: Insufficient documentation

## 2016-03-01 DIAGNOSIS — Z79899 Other long term (current) drug therapy: Secondary | ICD-10-CM | POA: Insufficient documentation

## 2016-03-01 DIAGNOSIS — I10 Essential (primary) hypertension: Secondary | ICD-10-CM | POA: Diagnosis not present

## 2016-03-01 DIAGNOSIS — C3412 Malignant neoplasm of upper lobe, left bronchus or lung: Secondary | ICD-10-CM

## 2016-03-01 LAB — COMPREHENSIVE METABOLIC PANEL
ALK PHOS: 76 U/L (ref 38–126)
ALT: 9 U/L — ABNORMAL LOW (ref 17–63)
ANION GAP: 10 (ref 5–15)
AST: 17 U/L (ref 15–41)
Albumin: 3.5 g/dL (ref 3.5–5.0)
BILIRUBIN TOTAL: 0.6 mg/dL (ref 0.3–1.2)
BUN: 25 mg/dL — ABNORMAL HIGH (ref 6–20)
CALCIUM: 9 mg/dL (ref 8.9–10.3)
CO2: 25 mmol/L (ref 22–32)
Chloride: 100 mmol/L — ABNORMAL LOW (ref 101–111)
Creatinine, Ser: 1.03 mg/dL (ref 0.61–1.24)
GFR calc non Af Amer: >60 mL/min (ref >60)
Glucose, Bld: 104 mg/dL — ABNORMAL HIGH (ref 65–99)
POTASSIUM: 4.3 mmol/L (ref 3.5–5.1)
Sodium: 135 mmol/L (ref 135–145)
TOTAL PROTEIN: 7.4 g/dL (ref 6.5–8.1)

## 2016-03-01 LAB — CBC WITH DIFFERENTIAL/PLATELET
Basophils Absolute: 0 10*3/uL (ref 0–0.1)
Basophils Relative: 0 %
EOS ABS: 0.1 10*3/uL (ref 0–0.7)
Eosinophils Relative: 1 %
HEMATOCRIT: 38.9 % — AB (ref 40.0–52.0)
HEMOGLOBIN: 13.2 g/dL (ref 13.0–18.0)
LYMPHS ABS: 1.7 10*3/uL (ref 1.0–3.6)
Lymphocytes Relative: 17 %
MCH: 28.6 pg (ref 26.0–34.0)
MCHC: 33.9 g/dL (ref 32.0–36.0)
MCV: 84.3 fL (ref 80.0–100.0)
MONO ABS: 1 10*3/uL (ref 0.2–1.0)
MONOS PCT: 10 %
NEUTROS ABS: 7.1 10*3/uL — AB (ref 1.4–6.5)
NEUTROS PCT: 72 %
Platelets: 215 10*3/uL (ref 150–440)
RBC: 4.62 MIL/uL (ref 4.40–5.90)
RDW: 16.2 % — AB (ref 11.5–14.5)
WBC: 10 10*3/uL (ref 3.8–10.6)

## 2016-03-05 NOTE — Progress Notes (Signed)
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  INTERVAL HISTORY: Patient returns to clinic today for further evaluation in routine follow-up. He continues to feel well and remains asymptomatic. He has no neurologic complaints. He denies any recent fevers or illnesses. He has a good appetite and denies weight loss. He denies any chest pain, shortness of breath, or hemoptysis. He has no nausea, vomiting, constipation, or diarrhea. He has no urinary complaints. Patient feels at his baseline and offers no specific complaints today.  REVIEW OF SYSTEMS:   Review of Systems  Constitutional: Negative.  Negative for fever, malaise/fatigue and weight loss.  Respiratory: Negative.  Negative for cough, hemoptysis and shortness of breath.   Cardiovascular: Negative.  Negative for chest pain.  Gastrointestinal: Negative.  Negative for abdominal pain.  Genitourinary: Negative.   Musculoskeletal: Negative.   Neurological: Negative.  Negative for weakness.  Psychiatric/Behavioral: Negative.     As per HPI. Otherwise, a complete review of systems is negatve.  PAST MEDICAL HISTORY: Past Medical History:  Diagnosis Date  . Glaucoma   . Lung cancer ([place])   . Urinary retention     PAST SURGICAL HISTORY: Past Surgical History:  Procedure Laterality Date  . PACEMAKER INSERTION    . STOMACH SURGERY     patient describes something put in his stomach to help blood flow to legs    FAMILY HISTORY: Family History  Problem Relation Age of Onset  . Kidney disease Neg Hx   . Prostate cancer Neg Hx        ADVANCED DIRECTIVES:    HEALTH MAINTENANCE: Social History  Substance Use Topics  . Smoking status: Former Research scientist (life sciences)  . Smokeless tobacco: Not on  file     Comment: quit 40 years  . Alcohol use No     Colonoscopy:  PAP:  Bone density:  Lipid panel:  Allergies  Allergen Reactions  . Donepezil Other (See Comments)    Current Outpatient Prescriptions  Medication Sig Dispense Refill  . aspirin EC 81 MG tablet Take by mouth.    . budesonide-formoterol (SYMBICORT) 160-4.5 MCG/ACT inhaler INHALE 2 PUFFS BY MOUTH TWICE DAILY AS DIRECTED    . clotrimazole-betamethasone (LOTRISONE) cream Apply 1 application topically 2 (two) times daily. 30 g 0  . cyanocobalamin (,VITAMIN B-12,) 1000 MCG/ML injection Inject into the muscle. Reported on 11/11/2015    . finasteride (PROSCAR) 5 MG tablet Take 1 tablet (5 mg total) by mouth daily. 90 tablet 3  . pravastatin (PRAVACHOL) 40 MG tablet TAKE 1 TABLET BY MOUTH EVERY DAY    . tiotropium (SPIRIVA HANDIHALER) 18 MCG inhalation capsule INHALE 1 CAPSULE VIA HANDIHALER EVERY DAY    . tobramycin (TOBREX) 0.3 % ophthalmic solution Apply to eye.    . tamsulosin (FLOMAX) 0.4 MG CAPS capsule Take 1 capsule (0.4 mg total) by mouth daily. 90 capsule 3   No current facility-administered medications for this visit.     OBJECTIVE: Vitals:   03/01/16 1624  BP: 114/74  Pulse: 69     Body mass index is 22.6 kg/m.    ECOG FS:0 - Asymptomatic  General: Well-developed, well-nourished, no acute distress. Eyes: Pink conjunctiva, anicteric sclera. HEENT: Normocephalic, moist mucous membranes, clear oropharnyx. Lungs: Clear to auscultation bilaterally. Heart: Regular rate and rhythm. No  rubs, murmurs, or gallops. Abdomen: Soft, nontender, nondistended. No organomegaly noted, normoactive bowel sounds. Musculoskeletal: No edema, cyanosis, or clubbing. Neuro: Alert, answering all questions appropriately. Cranial nerves grossly intact. Skin: No rashes or petechiae noted. Psych: Normal affect. Lymphatics: No cervical, calvicular, axillary or inguinal LAD.   LAB RESULTS:  Lab Results  Component Value Date    NA 135 03/01/2016   K 4.3 03/01/2016   CL 100 (L) 03/01/2016   CO2 25 03/01/2016   GLUCOSE 104 (H) 03/01/2016   BUN 25 (H) 03/01/2016   CREATININE 1.03 03/01/2016   CALCIUM 9.0 03/01/2016   PROT 7.4 03/01/2016   ALBUMIN 3.5 03/01/2016   AST 17 03/01/2016   ALT 9 (L) 03/01/2016   ALKPHOS 76 03/01/2016   BILITOT 0.6 03/01/2016   GFRNONAA >60 03/01/2016   GFRAA >60 03/01/2016    Lab Results  Component Value Date   WBC 10.0 03/01/2016   NEUTROABS 7.1 (H) 03/01/2016   HGB 13.2 03/01/2016   HCT 38.9 (L) 03/01/2016   MCV 84.3 03/01/2016   PLT 215 03/01/2016     STUDIES: No results found.  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  PLAN:    1. 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 Repeat CT scan in September 2013 revealed progressive disease a second biopsy was completed which is also negative for malignant cells. Patient's most recent imaging with PET scan on August 31, 2015 revealed a persistent 5.0 cm left upper lobe mass consistent with recurrent malignancy. (Of note, patient is still considered T2. T3 lesions are greater than 5.0 cm.)  Previously this mass measured 3.6 cm. There is also mild hypermetabolism in the left hilum without a discrete lymph node apparent. After lengthy discussion with the patient and his daughter, they continue to be hesitant for additional treatment but would consider immunotherapy if necessary. Will repeat imaging with CT scan in the next 1-2 weeks to assess for interval change. Patient will then return to clinic several days after to discuss the results and treatment planning if necessary.  Approximately 30 minutes was spent in discussion of which greater than 50% was consultation.  Patient expressed understanding and was in agreement with this plan. He also understands that He can call clinic at any time  with any questions, concerns, or complaints.   No matching staging information was found for the patient.  Lloyd Huger, MD   03/05/2016 8:25 AM

## 2016-03-16 ENCOUNTER — Encounter: Payer: Self-pay | Admitting: Emergency Medicine

## 2016-03-16 ENCOUNTER — Observation Stay
Admission: EM | Admit: 2016-03-16 | Discharge: 2016-03-17 | Disposition: A | Discharge status: Home / Self Care | Payer: PPO | Attending: Internal Medicine | Admitting: Internal Medicine

## 2016-03-16 ENCOUNTER — Observation Stay: Payer: PPO

## 2016-03-16 ENCOUNTER — Other Ambulatory Visit: Payer: Self-pay | Admitting: Internal Medicine

## 2016-03-16 ENCOUNTER — Ambulatory Visit
Admission: RE | Admit: 2016-03-16 | Discharge: 2016-03-16 | Disposition: A | Discharge status: Home / Self Care | Payer: PPO | Source: Ambulatory Visit | Attending: Oncology | Admitting: Oncology

## 2016-03-16 ENCOUNTER — Observation Stay
Admit: 2016-03-16 | Discharge: 2016-03-16 | Disposition: A | Discharge status: Home / Self Care | Payer: PPO | Attending: Internal Medicine | Admitting: Internal Medicine

## 2016-03-16 DIAGNOSIS — I2699 Other pulmonary embolism without acute cor pulmonale: Secondary | ICD-10-CM | POA: Diagnosis not present

## 2016-03-16 DIAGNOSIS — Z7982 Long term (current) use of aspirin: Secondary | ICD-10-CM | POA: Diagnosis not present

## 2016-03-16 DIAGNOSIS — J449 Chronic obstructive pulmonary disease, unspecified: Secondary | ICD-10-CM | POA: Insufficient documentation

## 2016-03-16 DIAGNOSIS — I2782 Chronic pulmonary embolism: Secondary | ICD-10-CM | POA: Diagnosis present

## 2016-03-16 DIAGNOSIS — Z923 Personal history of irradiation: Secondary | ICD-10-CM | POA: Insufficient documentation

## 2016-03-16 DIAGNOSIS — J90 Pleural effusion, not elsewhere classified: Secondary | ICD-10-CM | POA: Insufficient documentation

## 2016-03-16 DIAGNOSIS — Z85118 Personal history of other malignant neoplasm of bronchus and lung: Secondary | ICD-10-CM | POA: Insufficient documentation

## 2016-03-16 DIAGNOSIS — N401 Enlarged prostate with lower urinary tract symptoms: Secondary | ICD-10-CM | POA: Insufficient documentation

## 2016-03-16 DIAGNOSIS — I700 Atherosclerosis of aorta: Secondary | ICD-10-CM | POA: Insufficient documentation

## 2016-03-16 DIAGNOSIS — Z87891 Personal history of nicotine dependence: Secondary | ICD-10-CM | POA: Insufficient documentation

## 2016-03-16 DIAGNOSIS — E785 Hyperlipidemia, unspecified: Secondary | ICD-10-CM | POA: Insufficient documentation

## 2016-03-16 DIAGNOSIS — R918 Other nonspecific abnormal finding of lung field: Secondary | ICD-10-CM

## 2016-03-16 DIAGNOSIS — Z950 Presence of cardiac pacemaker: Secondary | ICD-10-CM | POA: Insufficient documentation

## 2016-03-16 DIAGNOSIS — H409 Unspecified glaucoma: Secondary | ICD-10-CM | POA: Diagnosis not present

## 2016-03-16 DIAGNOSIS — C3490 Malignant neoplasm of unspecified part of unspecified bronchus or lung: Secondary | ICD-10-CM | POA: Diagnosis not present

## 2016-03-16 DIAGNOSIS — I2609 Other pulmonary embolism with acute cor pulmonale: Secondary | ICD-10-CM | POA: Diagnosis not present

## 2016-03-16 DIAGNOSIS — R338 Other retention of urine: Secondary | ICD-10-CM | POA: Insufficient documentation

## 2016-03-16 DIAGNOSIS — J9610 Chronic respiratory failure, unspecified whether with hypoxia or hypercapnia: Secondary | ICD-10-CM | POA: Diagnosis not present

## 2016-03-16 LAB — CBC WITH DIFFERENTIAL/PLATELET
Basophils Absolute: 0 10*3/uL (ref 0–0.1)
Basophils Relative: 0 %
EOS ABS: 0.1 10*3/uL (ref 0–0.7)
EOS PCT: 1 %
HCT: 39.7 % — ABNORMAL LOW (ref 40.0–52.0)
Hemoglobin: 13.2 g/dL (ref 13.0–18.0)
Lymphocytes Relative: 11 %
Lymphs Abs: 1.1 10*3/uL (ref 1.0–3.6)
MCH: 28.7 pg (ref 26.0–34.0)
MCHC: 33.4 g/dL (ref 32.0–36.0)
MCV: 86.2 fL (ref 80.0–100.0)
MONOS PCT: 10 %
Monocytes Absolute: 1 10*3/uL (ref 0.2–1.0)
NEUTROS ABS: 8 10*3/uL — AB (ref 1.4–6.5)
NEUTROS PCT: 78 %
PLATELETS: 209 10*3/uL (ref 150–440)
RBC: 4.6 MIL/uL (ref 4.40–5.90)
RDW: 15.8 % — ABNORMAL HIGH (ref 11.5–14.5)
WBC: 10.2 10*3/uL (ref 3.8–10.6)

## 2016-03-16 LAB — APTT: APTT: 34 s (ref 24–36)

## 2016-03-16 LAB — COMPREHENSIVE METABOLIC PANEL
ALK PHOS: 74 U/L (ref 38–126)
ALT: 8 U/L — ABNORMAL LOW (ref 17–63)
ANION GAP: 8 (ref 5–15)
AST: 18 U/L (ref 15–41)
Albumin: 3.2 g/dL — ABNORMAL LOW (ref 3.5–5.0)
BUN: 17 mg/dL (ref 6–20)
CALCIUM: 9.2 mg/dL (ref 8.9–10.3)
CHLORIDE: 105 mmol/L (ref 101–111)
CO2: 25 mmol/L (ref 22–32)
Creatinine, Ser: 0.76 mg/dL (ref 0.61–1.24)
GFR calc non Af Amer: >60 mL/min (ref >60)
Glucose, Bld: 90 mg/dL (ref 65–99)
Potassium: 4.2 mmol/L (ref 3.5–5.1)
SODIUM: 138 mmol/L (ref 135–145)
Total Bilirubin: 0.7 mg/dL (ref 0.3–1.2)
Total Protein: 7.2 g/dL (ref 6.5–8.1)

## 2016-03-16 LAB — PROTIME-INR
INR: 1.13
PROTHROMBIN TIME: 14.6 s (ref 11.4–15.2)

## 2016-03-16 MED ORDER — TAMSULOSIN HCL 0.4 MG PO CAPS
0.4000 mg | ORAL_CAPSULE | Freq: Every day | ORAL | Status: DC
  Administered 2016-03-16: 0.4 mg via ORAL
  Filled 2016-03-16: qty 1

## 2016-03-16 MED ORDER — TOBRAMYCIN 0.3 % OP SOLN
1.0000 [drp] | Freq: Three times a day (TID) | OPHTHALMIC | Status: DC
  Administered 2016-03-16: 1 [drp] via OPHTHALMIC
  Filled 2016-03-16: qty 5

## 2016-03-16 MED ORDER — HEPARIN SODIUM (PORCINE) 5000 UNIT/ML IJ SOLN: 60.0000 [IU]/kg | Freq: Once | INTRAMUSCULAR | Status: DC

## 2016-03-16 MED ORDER — TIOTROPIUM BROMIDE MONOHYDRATE 18 MCG IN CAPS
18.0000 ug | ORAL_CAPSULE | Freq: Every day | RESPIRATORY_TRACT | Status: DC
  Administered 2016-03-16 – 2016-03-17 (×2): 18 ug via RESPIRATORY_TRACT
  Filled 2016-03-16: qty 5

## 2016-03-16 MED ORDER — SODIUM CHLORIDE 0.9% FLUSH
3.0000 mL | INTRAVENOUS | Status: DC | PRN
Start: 2016-03-16 — End: 2016-03-17

## 2016-03-16 MED ORDER — HEPARIN BOLUS VIA INFUSION
3000.0000 [IU] | Freq: Once | INTRAVENOUS | Status: AC
  Administered 2016-03-16: 3000 [IU] via INTRAVENOUS
  Filled 2016-03-16: qty 3000

## 2016-03-16 MED ORDER — PRAVASTATIN SODIUM 40 MG PO TABS
40.0000 mg | ORAL_TABLET | Freq: Every day | ORAL | Status: DC
  Administered 2016-03-17: 40 mg via ORAL
  Filled 2016-03-16: qty 1

## 2016-03-16 MED ORDER — SODIUM CHLORIDE 0.9 % IV SOLN
Freq: Once | INTRAVENOUS | Status: AC
  Administered 2016-03-16: 14:00:00 via INTRAVENOUS

## 2016-03-16 MED ORDER — ONDANSETRON HCL 4 MG/2ML IJ SOLN
4.0000 mg | Freq: Four times a day (QID) | INTRAMUSCULAR | Status: DC | PRN
Start: 2016-03-16 — End: 2016-03-17

## 2016-03-16 MED ORDER — POLYETHYLENE GLYCOL 3350 17 G PO PACK
17.0000 g | PACK | Freq: Every day | ORAL | Status: DC | PRN
Start: 2016-03-16 — End: 2016-03-17

## 2016-03-16 MED ORDER — HEPARIN (PORCINE) IN NACL 100-0.45 UNIT/ML-% IJ SOLN: 12.0000 [IU]/kg/h | Freq: Once | INTRAMUSCULAR | Status: DC

## 2016-03-16 MED ORDER — DOCUSATE SODIUM 100 MG PO CAPS
100.0000 mg | ORAL_CAPSULE | Freq: Two times a day (BID) | ORAL | Status: DC
  Administered 2016-03-16 – 2016-03-17 (×3): 100 mg via ORAL
  Filled 2016-03-16 (×3): qty 1

## 2016-03-16 MED ORDER — HEPARIN (PORCINE) IN NACL 100-0.45 UNIT/ML-% IJ SOLN
1000.0000 [IU]/h | INTRAMUSCULAR | Status: DC
  Administered 2016-03-16: 1000 [IU]/h via INTRAVENOUS
  Filled 2016-03-16: qty 250

## 2016-03-16 MED ORDER — SODIUM CHLORIDE 0.9% FLUSH
3.0000 mL | Freq: Two times a day (BID) | INTRAVENOUS | Status: DC
  Administered 2016-03-16 – 2016-03-17 (×2): 3 mL via INTRAVENOUS

## 2016-03-16 MED ORDER — MOMETASONE FURO-FORMOTEROL FUM 200-5 MCG/ACT IN AERO
2.0000 | INHALATION_SPRAY | Freq: Two times a day (BID) | RESPIRATORY_TRACT | Status: DC
  Administered 2016-03-16 – 2016-03-17 (×3): 2 via RESPIRATORY_TRACT
  Filled 2016-03-16: qty 8.8

## 2016-03-16 MED ORDER — ACETAMINOPHEN 325 MG PO TABS: 650.0000 mg | ORAL_TABLET | Freq: Four times a day (QID) | ORAL | Status: DC | PRN

## 2016-03-16 MED ORDER — IOPAMIDOL (ISOVUE-300) INJECTION 61%
75.0000 mL | Freq: Once | INTRAVENOUS | Status: AC | PRN
  Administered 2016-03-16: 75 mL via INTRAVENOUS

## 2016-03-16 MED ORDER — ALBUTEROL SULFATE (2.5 MG/3ML) 0.083% IN NEBU
2.5000 mg | INHALATION_SOLUTION | RESPIRATORY_TRACT | Status: DC | PRN
Start: 2016-03-16 — End: 2016-03-17

## 2016-03-16 MED ORDER — SODIUM CHLORIDE 0.9 % IV SOLN: 250.0000 mL | INTRAVENOUS | Status: DC | PRN

## 2016-03-16 MED ORDER — ONDANSETRON HCL 4 MG PO TABS: 4.0000 mg | ORAL_TABLET | Freq: Four times a day (QID) | ORAL | Status: DC | PRN

## 2016-03-16 MED ORDER — APIXABAN 5 MG PO TABS
10.0000 mg | ORAL_TABLET | Freq: Once | ORAL | Status: AC
  Administered 2016-03-16: 10 mg via ORAL
  Filled 2016-03-16: qty 2

## 2016-03-16 MED ORDER — ACETAMINOPHEN 650 MG RE SUPP: 650.0000 mg | Freq: Four times a day (QID) | RECTAL | Status: DC | PRN

## 2016-03-16 NOTE — ED Notes (Signed)
Admitting MD at bedside.

## 2016-03-16 NOTE — H&P (Signed)
[facility] [place] NAME: Johnathan Dunn    MR#:  [id number]  DATE OF BIRTH:  02-Mar-1923  DATE OF ADMISSION:  03/16/2016  PRIMARY CARE PHYSICIAN: Kirk Ruths., MD   REQUESTING/REFERRING PHYSICIAN: Dr. Jimmye Norman  CHIEF COMPLAINT:   Chief Complaint  Patient presents with  . Shortness of Breath    HISTORY OF PRESENT ILLNESS:  Johnathan Dunn  is a 82 y.o. male with a known history of COPD, left lung cancer status post radiation presents to the hospital from radiology department after a routine CT scan showed left main pulmonary artery embolism. There is some concern that this could be extension of tumor. He also has distal emboli. Patient has had diagnosis of left lung cancer and went through radiation. He has seen Dr. Grayland Ormond of oncology recently. A CT scan was ordered for follow-up. Today his tumor has grown in size.  He does have chronic shortness of breath and cough which are unchanged. But on ambulating patient felt lightheaded and more dyspneic and is being admitted under observation.  PAST MEDICAL HISTORY:   Past Medical History:  Diagnosis Date  . Glaucoma   . Lung cancer ([place])   . Urinary retention     PAST SURGICAL HISTORY:   Past Surgical History:  Procedure Laterality Date  . PACEMAKER INSERTION    . STOMACH SURGERY     patient describes something put in his stomach to help blood flow to legs    SOCIAL HISTORY:   Social History  Substance Use Topics  . Smoking status: Former Research scientist (life sciences)  . Smokeless tobacco: Never Used     Comment: quit 40 years  . Alcohol use No    FAMILY HISTORY:   Family History  Problem Relation Age of Onset  . Kidney disease Neg Hx   . Prostate cancer Neg Hx     DRUG ALLERGIES:   Allergies  Allergen Reactions  . Donepezil Other (See Comments)    hallucinations    REVIEW OF SYSTEMS:   Review of Systems  Constitutional: Positive for malaise/fatigue and weight loss. Negative  for chills and fever.  HENT: Negative for hearing loss and nosebleeds.   Eyes: Negative for blurred vision, double vision and pain.  Respiratory: Positive for cough and shortness of breath (chronic). Negative for hemoptysis, sputum production and wheezing.   Cardiovascular: Negative for chest pain, palpitations, orthopnea and leg swelling.  Gastrointestinal: Negative for abdominal pain, constipation, diarrhea, nausea and vomiting.  Genitourinary: Negative for dysuria and hematuria.  Musculoskeletal: Negative for back pain, falls and myalgias.  Skin: Negative for rash.  Neurological: Positive for weakness. Negative for dizziness, tremors, sensory change, speech change, focal weakness, seizures and headaches.  Endo/Heme/Allergies: Does not bruise/bleed easily.  Psychiatric/Behavioral: Positive for memory loss. Negative for depression. The patient is not nervous/anxious.     MEDICATIONS AT HOME:   Prior to Admission medications   Medication Sig Start Date End Date Taking? Authorizing Provider  aspirin EC 81 MG tablet Take by mouth.   Yes Historical Provider, MD  budesonide-formoterol (SYMBICORT) 160-4.5 MCG/ACT inhaler Inhale 2 puffs into the lungs daily.   Yes Historical Provider, MD  cyanocobalamin (,VITAMIN B-12,) 1000 MCG/ML injection Inject 1,000 mcg into the muscle every 30 (thirty) days. Reported on 11/11/2015   Yes Historical Provider, MD  pravastatin (PRAVACHOL) 40 MG tablet TAKE 1 TABLET BY MOUTH EVERY DAY 12/29/15  Yes Historical Provider, MD  tamsulosin (FLOMAX) 0.4 MG CAPS capsule Take 1 capsule (0.4  mg total) by mouth daily. 11/11/15  Yes Shannon A McGowan, PA-C  tiotropium (SPIRIVA HANDIHALER) 18 MCG inhalation capsule INHALE 1 CAPSULE VIA HANDIHALER EVERY DAY 02/10/16  Yes Historical Provider, MD  tobramycin (TOBREX) 0.3 % ophthalmic solution Place 1 drop into both eyes 3 (three) times daily.  08/11/15   Historical Provider, MD     VITAL SIGNS:  Blood pressure (!) 95/50, pulse (!) 38,  temperature 97.7 F (36.5 C), temperature source Oral, resp. rate (!) 24, height '5\' 5"'$  (1.651 m), weight 63 kg (139 lb), SpO2 96 %.  PHYSICAL EXAMINATION:  Physical Exam  GENERAL:  82 y.o.-year-old patient lying in the bed with no acute distress.  EYES: Pupils equal, round, reactive to light and accommodation. No scleral icterus. Extraocular muscles intact.  HEENT: Head atraumatic, normocephalic. Oropharynx and nasopharynx clear. No oropharyngeal erythema, moist oral mucosa  NECK:  Supple, no jugular venous distention. No thyroid enlargement, no tenderness.  LUNGS: Normal breath sounds bilaterally, no wheezing, rales, rhonchi. No use of accessory muscles of respiration.  CARDIOVASCULAR: S1, S2 normal. No murmurs, rubs, or gallops.  ABDOMEN: Soft, nontender, nondistended. Bowel sounds present. No organomegaly or mass.  EXTREMITIES: No pedal edema, cyanosis, or clubbing. + 2 pedal & radial pulses b/l.   NEUROLOGIC: Cranial nerves II through XII are intact. No focal Motor or sensory deficits appreciated b/l PSYCHIATRIC: The patient is alert and oriented x 3. Good affect.  SKIN: No obvious rash, lesion, or ulcer.   LABORATORY PANEL:   CBC  Recent Labs Lab 03/16/16 1156  WBC 10.2  HGB 13.2  HCT 39.7*  PLT 209   ------------------------------------------------------------------------------------------------------------------  Chemistries   Recent Labs Lab 03/16/16 1156  NA 138  K 4.2  CL 105  CO2 25  GLUCOSE 90  BUN 17  CREATININE 0.76  CALCIUM 9.2  AST 18  ALT 8*  ALKPHOS 74  BILITOT 0.7   ------------------------------------------------------------------------------------------------------------------  Cardiac Enzymes No results for input(s): TROPONINI in the last 168 hours. ------------------------------------------------------------------------------------------------------------------  RADIOLOGY:  Ct Chest W Contrast  Result Date: 03/16/2016 CLINICAL DATA:   Followup lung cancer EXAM: CT CHEST WITH CONTRAST TECHNIQUE: Multidetector CT imaging of the chest was performed during intravenous contrast administration. CONTRAST:  65m ISOVUE-300 IOPAMIDOL (ISOVUE-300) INJECTION 61% COMPARISON:  08/31/2015 FINDINGS: Cardiovascular: Normal heart size. No pericardial effusion. There is aortic atherosclerosis identified. Calcification in the left main coronary artery, LAD and left circumflex coronary arteries noted. There is a large clot scratch set there is a large embolus involving the left main pulmonary artery, image 63 of series 2. This is contiguous with the left upper lobe lung mass. I cannot be certain if this is tumor thrombus or bland thrombus. More distally within the left lower lobe pulmonary artery there is a a separate thrombus, image number 79 of series 2 this does not appear attached to the larger thrombus within the central left pulmonary artery and is favored to represent bland thrombus. Mediastinum/Nodes: Right hilar node Measures 1.3 cm, image 73 of series 2. This is difficult to visualize on the previous exam. No enlarged right paratracheal or sub- carinal lymph nodes. Lungs/Pleura: Very small left pleural effusion. Mass involving the left upper lobe Measures 6.6 by 5.7 cm, image 33 of series 2. On the previous examination this measured 4.6 by 4.4 cm peer moderate to advanced changes of centrilobular and paraseptal emphysema identified. Scattered scar like densities noted in the right upper lobe and right middle lobe. Diffuse bronchial wall thickening noted. Upper Abdomen: Several scattered  low-density foci are identified within the liver. These are favored to represent cysts. Nodule in the right adrenal gland measures 1.2 cm and is unchanged from previous exam. Musculoskeletal: No aggressive lytic or sclerotic bone lesions. IMPRESSION: 1. Left upper lobe lung mass has increased in size when compared with the previous exam compatible with progression of  disease. 2. Large filling defect is identified within the left main pulmonary artery. It is unclear whether not this represents bland thrombus or tumor thrombus as this appears contiguous with the left upper lobe lung mass. Smaller, separate pulmonary artery filling defects are identified within the left lower lobe compatible with bland a pulmonary emboli. Critical Value/emergent results were called by telephone at the time of interpretation on 03/16/2016 at 11:23 am to Dr. Lowell Bouton, who verbally acknowledged these results. Electronically Signed   By: Kerby Moors M.D.   On: 03/16/2016 11:23     IMPRESSION AND PLAN:   * Left pulmonary embolism with worsening lung cancer There is concern if the pulmonary artery thrombus could be extension of the tumor. We'll start on Eliquis. Patient had more shortness of breath and felt lightheaded on ambulating and will be admitted under observation. Telemetry. Check echocardiogram and lower extremity Dopplers. Consult Dr. Grayland Ormond with oncology.  * BPH Continue Flomax  * COPD stable. Continue inhalers. He does have chronic cough. May need home oxygen at discharge.  All the records are reviewed and case discussed with ED provider. Management plans discussed with the patient, family and they are in agreement.  CODE STATUS: FULL CODE  TOTAL TIME TAKING CARE OF THIS PATIENT: 40 minutes.   Hillary Bow R M.D on 03/16/2016 at 1:22 PM  Between 7am to 6pm - Pager - [phone]  After 6pm go to [email or web address] - password [email or web address] [facility]  Office  [phone]  CC: Primary care physician; Kirk Ruths., MD  Note: This dictation was prepared with Dragon dictation along with smaller phrase technology. Any transcriptional errors that result from this process are unintentional.

## 2016-03-16 NOTE — ED Provider Notes (Addendum)
[facility] Emergency Department Provider Note        Time seen: ----------------------------------------- 11:33 AM on 03/16/2016 -----------------------------------------    I have reviewed the triage vital signs and the nursing notes.   HISTORY  Chief Complaint No chief complaint on file.    HPI Johnathan Dunn is a 82 y.o. male brought the ER after a positive outpatient CT scan. Reportedly he has lung cancer and was having a CT scan for staging. CT scan revealed pulmonary embolus or pulmonary emboli. He was sent to the ER for further evaluation.Patient denies shortness of breath but family states he has been having difficulty breathing. They deny recent illness or other complaints this time. He denies any chest pain.   Past Medical History:  Diagnosis Date  . Glaucoma   . Lung cancer ([place])   . Urinary retention     Patient Active Problem List   Diagnosis Date Noted  . Benign prostatic hyperplasia with urinary obstruction 12/01/2015  . BPH (benign prostatic hypertrophy) with urinary retention 11/11/2015  . Balanitis 11/11/2015  . Alzheimer's dementia, late onset 11/26/2014  . Primary cancer of left upper lobe of lung ([place]) 12/29/2013  . Addison anemia 12/29/2013  . Peripheral vascular disease ([place]) 12/29/2013  . Breathlessness on exertion 12/26/2013  . COPD (chronic obstructive pulmonary disease) ([place]) 11/02/2013  . Hyperlipemia 11/02/2013  . Artificial cardiac pacemaker 02/10/2013    Past Surgical History:  Procedure Laterality Date  . PACEMAKER INSERTION    . STOMACH SURGERY     patient describes something put in his stomach to help blood flow to legs    Allergies Donepezil  Social History Social History  Substance Use Topics  . Smoking status: Former Research scientist (life sciences)  . Smokeless tobacco: Not on file     Comment: quit 40 years  . Alcohol use No    Review of Systems Constitutional: Negative for fever. Eyes: Negative for visual  changes. ENT: Negative for sore throat. Cardiovascular: Negative for chest pain. Respiratory: Positive for dyspnea Gastrointestinal: Negative for abdominal pain, vomiting and diarrhea. Genitourinary: Negative for dysuria. Musculoskeletal: Negative for back pain. Skin: Negative for rash. Neurological: Negative for headaches, focal weakness or numbness.  10-point ROS otherwise negative.  ____________________________________________   PHYSICAL EXAM:  VITAL SIGNS: ED Triage Vitals  Enc Vitals Group     BP      Pulse      Resp      Temp      Temp src      SpO2      Weight      Height      Head Circumference      Peak Flow      Pain Score      Pain Loc      Pain Edu?      Excl. in [place]?     Constitutional: Alert and oriented. Mild distress Eyes: Conjunctivae are normal. PERRL. Normal extraocular movements. ENT   Head: Normocephalic and atraumatic.   Nose: No congestion/rhinnorhea.   Mouth/Throat: Mucous membranes are moist.   Neck: No stridor. Cardiovascular: Normal rate, regular rhythm. No murmurs, rubs, or gallops. Respiratory: Pursed lip breathing, prolonged expirations. Scattered rhonchi. Gastrointestinal: Soft and nontender. Normal bowel sounds Musculoskeletal: Nontender with normal range of motion in all extremities. No lower extremity tenderness nor edema. Neurologic:  Normal speech and language. No gross focal neurologic deficits are appreciated.  Skin:  Skin is warm, dry and intact. No rash noted. Psychiatric: Mood and affect  are normal. Speech and behavior are normal.  ____________________________________________  EKG: Interpreted by me. AV dual paced rhythm with a rate of 74 bpm. No other acute changes are identified  ____________________________________________  ED COURSE:  Pertinent labs & imaging results that were available during my care of the patient were reviewed by me and considered in my medical decision making (see chart for  details). Clinical Course  Patient presents to ER with positive outpatient CT scan for PE. I will review the study, he may require heparin and admission.  Procedures ____________________________________________   LABS (pertinent positives/negatives)  Labs Reviewed  CBC WITH DIFFERENTIAL/PLATELET - Abnormal; Notable for the following:       Result Value   HCT 39.7 (*)    RDW 15.8 (*)    Neutro Abs 8.0 (*)    All other components within normal limits  COMPREHENSIVE METABOLIC PANEL - Abnormal; Notable for the following:    Albumin 3.2 (*)    ALT 8 (*)    All other components within normal limits  PROTIME-INR  APTT    RADIOLOGY Images were viewed by me  IMPRESSION: 1. Left upper lobe lung mass has increased in size when compared with the previous exam compatible with progression of disease.  2. Large filling defect is identified within the left main pulmonary artery. It is unclear whether not this represents bland thrombus or tumor thrombus as this appears contiguous with the left upper lobe lung mass. Smaller, separate pulmonary artery filling defects are identified within the left lower lobe compatible with bland a pulmonary emboli.  Critical Value/emergent results were called by telephone at the time of interpretation on 03/16/2016 at 11:23 am to Dr. Lowell Bouton, who verbally acknowledged these results.   ____________________________________________  FINAL ASSESSMENT AND PLAN  Lung mass, pulmonary thrombosis, pulmonary emboli  Plan: Patient with labs and imaging as dictated above. Patient with a large filling defect and multiple other defects as dictated above. Patient will be started on heparin, we will discuss with the hospitalist for admission.Patient appears stable clinically at this time.   Earleen Newport, MD   Note: This dictation was prepared with Dragon dictation. Any transcriptional errors that result from this process are unintentional     Earleen Newport, MD 03/16/16 [place], MD 03/16/16 [phone]

## 2016-03-16 NOTE — ED Notes (Signed)
Patient given coffee and tolerating well. Also able to tolerate his PO medication without difficulty

## 2016-03-16 NOTE — ED Notes (Signed)
Patient transported to Ultrasound 

## 2016-03-16 NOTE — ED Notes (Signed)
The patient was walked a short distance. While monitoring his SPO2 which was initially 95% room air, it decreased to 88% room air. He also stated that he became a little lightheaded during the walk.

## 2016-03-16 NOTE — ED Triage Notes (Signed)
Patient presents to the ED post CT chest that showed a PE.  Patient went for the CT to check status of patient's lung cancer, to see if it is stable or worsening.  Patient denies chest pain or shortness of breath however, patient's daughter noted patient's pursed lip breathing is not usual.  Patient appears to be having some difficulty breathing.  Patient is not currently receiving chemo or radiation for his lung cancer.  Patient is alert and oriented x 3 at this time.

## 2016-03-16 NOTE — ED Notes (Signed)
Meal tray given to patient.

## 2016-03-16 NOTE — Progress Notes (Signed)
ANTICOAGULATION CONSULT NOTE - Follow Up Consult  Pharmacy Consult for Heparin Indication: pulmonary embolus  Allergies  Allergen Reactions  . Donepezil Other (See Comments)    Patient Measurements: Height: '5\' 5"'$  (165.1 cm) Weight: 139 lb (63 kg) IBW/kg (Calculated) : 61.5 Heparin Dosing Weight: 63 kg  Vital Signs: Temp: 97.7 F (36.5 C) (08/09 1138) Temp Source: Oral (08/09 1138) BP: 90/66 (08/09 1138) Pulse Rate: 73 (08/09 1138)  Labs: No results for input(s): HGB, HCT, PLT, APTT, LABPROT, INR, HEPARINUNFRC, HEPRLOWMOCWT, CREATININE, CKTOTAL, CKMB, TROPONINI in the last 72 hours.  Estimated Creatinine Clearance: 40.6 mL/min (by C-G formula based on SCr of 1.03 mg/dL).   Medications:  Infusions:  . heparin      Assessment: 82 yo male having CT for lung cancer treatment.  CT revealed pulmonary embolus.  Pt denies chest pain or shortness of breath.  Started Heparin for treatment of PE  Goal of Therapy:  Heparin level 0.3-0.7 units/ml Monitor platelets by anticoagulation protocol: Yes   Plan:  Give 1000 units bolus x 1 Start heparin infusion at 1000 units/hr Check anti-Xa level in 8 hours and daily while on heparin Continue to monitor H&H and platelets  Latrish Mogel K 03/16/2016,11:57 AM

## 2016-03-16 NOTE — ED Notes (Signed)
Informed Admitting MD that patient received the 3000 unit bolus of heparin before the medicine had been ordered to be discontinued.  Admitting MD verified that this is okay and that the patient can be disconnected from the drip because he will be sent home with eliquis.  Also informed the EDP of this new change in decisions and medicine.

## 2016-03-16 NOTE — ED Notes (Signed)
Patient returned from ultrasound.

## 2016-03-16 NOTE — Consult Note (Signed)
ANTICOAGULATION CONSULT NOTE - Initial Consult  Pharmacy Consult for apixaban Indication: pulmonary embolus  Allergies  Allergen Reactions  . Donepezil Other (See Comments)    hallucinations    Patient Measurements: Height: '5\' 5"'$  (165.1 cm) Weight: 139 lb (63 kg) IBW/kg (Calculated) : 61.5 Heparin Dosing Weight:   Vital Signs: Temp: 97.7 F (36.5 C) (08/09 1138) Temp Source: Oral (08/09 1138) BP: 95/50 (08/09 1200) Pulse Rate: 38 (08/09 1200)  Labs:  Recent Labs  03/16/16 1156  HGB 13.2  HCT 39.7*  PLT 209  APTT 34  LABPROT 14.6  INR 1.13  CREATININE 0.76    Estimated Creatinine Clearance: 52.3 mL/min (by C-G formula based on SCr of 0.8 mg/dL).   Medical History: Past Medical History:  Diagnosis Date  . Glaucoma   . Lung cancer ([place])   . Urinary retention     Medications:  Scheduled:  . apixaban  10 mg Oral Once  . docusate sodium  100 mg Oral BID  . mometasone-formoterol  2 puff Inhalation BID  . [START ON 03/17/2016] pravastatin  40 mg Oral Daily  . sodium chloride flush  3 mL Intravenous Q12H  . tamsulosin  0.4 mg Oral QPC supper  . tiotropium  18 mcg Inhalation Daily  . tobramycin  1 drop Both Eyes TID    Assessment: Pt is a 82 year old male found to have a PE on CT. Pt was initially started on heparin drip. Pt only received 3000 unit bolus. Now is being transitioned to apixaban tx.   Goal of Therapy:   Monitor platelets by anticoagulation protocol: Yes   Plan:  apixaban '10mg'$  BID x 7 days then '5mg'$  BID. Monitor CBC and Scr q 3 days while inpatient.  Johnathan Dunn, Pharm.D Clinical Pharmacist  03/16/2016,1:28 PM

## 2016-03-17 DIAGNOSIS — C3490 Malignant neoplasm of unspecified part of unspecified bronchus or lung: Secondary | ICD-10-CM | POA: Diagnosis not present

## 2016-03-17 DIAGNOSIS — Z923 Personal history of irradiation: Secondary | ICD-10-CM

## 2016-03-17 DIAGNOSIS — Z7901 Long term (current) use of anticoagulants: Secondary | ICD-10-CM | POA: Diagnosis not present

## 2016-03-17 DIAGNOSIS — Z87891 Personal history of nicotine dependence: Secondary | ICD-10-CM

## 2016-03-17 DIAGNOSIS — J9610 Chronic respiratory failure, unspecified whether with hypoxia or hypercapnia: Secondary | ICD-10-CM | POA: Diagnosis not present

## 2016-03-17 DIAGNOSIS — I2699 Other pulmonary embolism without acute cor pulmonale: Secondary | ICD-10-CM | POA: Diagnosis not present

## 2016-03-17 DIAGNOSIS — C3412 Malignant neoplasm of upper lobe, left bronchus or lung: Secondary | ICD-10-CM

## 2016-03-17 DIAGNOSIS — F0390 Unspecified dementia without behavioral disturbance: Secondary | ICD-10-CM

## 2016-03-17 DIAGNOSIS — J449 Chronic obstructive pulmonary disease, unspecified: Secondary | ICD-10-CM | POA: Diagnosis not present

## 2016-03-17 DIAGNOSIS — J441 Chronic obstructive pulmonary disease with (acute) exacerbation: Secondary | ICD-10-CM | POA: Diagnosis not present

## 2016-03-17 LAB — BASIC METABOLIC PANEL
Anion gap: 5 (ref 5–15)
BUN: 13 mg/dL (ref 6–20)
CALCIUM: 9.1 mg/dL (ref 8.9–10.3)
CHLORIDE: 105 mmol/L (ref 101–111)
CO2: 28 mmol/L (ref 22–32)
CREATININE: 0.65 mg/dL (ref 0.61–1.24)
Glucose, Bld: 96 mg/dL (ref 65–99)
Potassium: 4 mmol/L (ref 3.5–5.1)
SODIUM: 138 mmol/L (ref 135–145)

## 2016-03-17 LAB — ECHOCARDIOGRAM COMPLETE
Height: 65 in
Weight: 2208 oz

## 2016-03-17 LAB — CBC
HCT: 36.1 % — ABNORMAL LOW (ref 40.0–52.0)
Hemoglobin: 12.4 g/dL — ABNORMAL LOW (ref 13.0–18.0)
MCH: 29 pg (ref 26.0–34.0)
MCHC: 34.2 g/dL (ref 32.0–36.0)
MCV: 84.9 fL (ref 80.0–100.0)
PLATELETS: 196 10*3/uL (ref 150–440)
RBC: 4.26 MIL/uL — AB (ref 4.40–5.90)
RDW: 16 % — AB (ref 11.5–14.5)
WBC: 7.8 10*3/uL (ref 3.8–10.6)

## 2016-03-17 MED ORDER — APIXABAN 5 MG PO TABS: ORAL_TABLET | ORAL | 0 refills | Status: DC

## 2016-03-17 MED ORDER — APIXABAN 5 MG PO TABS: 5.0000 mg | ORAL_TABLET | Freq: Two times a day (BID) | ORAL | Status: DC

## 2016-03-17 MED ORDER — APIXABAN 5 MG PO TABS
10.0000 mg | ORAL_TABLET | Freq: Two times a day (BID) | ORAL | Status: DC
  Administered 2016-03-17: 10 mg via ORAL
  Filled 2016-03-17: qty 2

## 2016-03-17 NOTE — Progress Notes (Addendum)
Discharge instructions along with home medications and follow up gone over with patient and son. Both verbalize that they understood instructions. One prescription given to patient. Both IV's and tele removed. Pt being discharged home on 2L oxygen with [place]. No distress noted. Ammie Dalton, RN

## 2016-03-17 NOTE — Discharge Summary (Signed)
[facility] NAME: Johnathan Dunn    MR#:  [id number]  DATE OF BIRTH:  1922/08/20  DATE OF ADMISSION:  03/16/2016 ADMITTING PHYSICIAN: Hillary Bow, MD  DATE OF DISCHARGE: 03/17/2016  2:55 PM  PRIMARY CARE PHYSICIAN: Kirk Ruths., MD    ADMISSION DIAGNOSIS:  Lung mass [R91.8] Pulmonary embolism ([place]) [I26.99] Other acute pulmonary embolism with acute cor pulmonale ([facility]) [I26.09]  DISCHARGE DIAGNOSIS:  Active Problems:   Pulmonary embolism ([place])   SECONDARY DIAGNOSIS:   Past Medical History:  Diagnosis Date  . Glaucoma   . Lung cancer ([place])   . Urinary retention     HOSPITAL COURSE:   1. Acute pulmonary embolism. The patient was started on eliquis. Risk of bleeding explained to patient and family. Patient asymptomatic. 2. Chronic respiratory failure pulse ox 81% on room air. Patient was set up with home oxygen 2 L nasal cannula continuous 24 7. 3. History of COPD lungs are clear upon discharge continue usual inhalers 4. History of lung cancer follow-up with oncology as outpatient. 5. Hyperlipidemia unspecified on pravastatin 6. BPH on Flomax  DISCHARGE CONDITIONS:   Satisfactory  CONSULTS OBTAINED:  Treatment Team:  Cammie Sickle, MD  DRUG ALLERGIES:   Allergies  Allergen Reactions  . Donepezil Other (See Comments)    hallucinations    DISCHARGE MEDICATIONS:   Discharge Medication List as of 03/17/2016  1:17 PM    START taking these medications   Details  apixaban (ELIQUIS) 5 MG TABS tablet Two tablets po twice a day for one week then one tablet po twice a day afterwards, Print      CONTINUE these medications which have NOT CHANGED   Details  budesonide-formoterol (SYMBICORT) 160-4.5 MCG/ACT inhaler Inhale 2 puffs into the lungs daily., Historical Med    cyanocobalamin (,VITAMIN B-12,) 1000 MCG/ML injection Inject 1,000 mcg into the muscle every 30 (thirty) days. Reported on 11/11/2015,  Historical Med    pravastatin (PRAVACHOL) 40 MG tablet TAKE 1 TABLET BY MOUTH EVERY DAY, Historical Med    tamsulosin (FLOMAX) 0.4 MG CAPS capsule Take 1 capsule (0.4 mg total) by mouth daily., Starting Wed 11/11/2015, Normal    tiotropium (SPIRIVA HANDIHALER) 18 MCG inhalation capsule INHALE 1 CAPSULE VIA HANDIHALER EVERY DAY, Historical Med    tobramycin (TOBREX) 0.3 % ophthalmic solution Place 1 drop into both eyes 3 (three) times daily. , Starting Tue 08/11/2015, Historical Med      STOP taking these medications     aspirin EC 81 MG tablet          DISCHARGE INSTRUCTIONS:   Follow-up PMD one week Follow-up oncology 2 weeks  If you experience worsening of your admission symptoms, develop shortness of breath, life threatening emergency, suicidal or homicidal thoughts you must seek medical attention immediately by calling 911 or calling your MD immediately  if symptoms less severe.  You Must read complete instructions/literature along with all the possible adverse reactions/side effects for all the Medicines you take and that have been prescribed to you. Take any new Medicines after you have completely understood and accept all the possible adverse reactions/side effects.   Please note  You were cared for by a hospitalist during your hospital stay. If you have any questions about your discharge medications or the care you received while you were in the hospital after you are discharged, you can call the unit and asked to speak with the hospitalist on call if the hospitalist that  took care of you is not available. Once you are discharged, your primary care physician will handle any further medical issues. Please note that NO REFILLS for any discharge medications will be authorized once you are discharged, as it is imperative that you return to your primary care physician (or establish a relationship with a primary care physician if you do not have one) for your aftercare needs so that they  can reassess your need for medications and monitor your lab values.    Today   CHIEF COMPLAINT:   Chief Complaint  Patient presents with  . Shortness of Breath    HISTORY OF PRESENT ILLNESS:  Johnathan Dunn  is a 82 y.o. male presented with shortness of breath and found to have a pulmonary embolism.   VITAL SIGNS:  Blood pressure (!) 107/57, pulse 70, temperature 98.4 F (36.9 C), resp. rate 19, height '5\' 5"'$  (1.651 m), weight 62.1 kg (137 lb), SpO2 (!) 86 %. Pulse ox 86% on room air. 98% on oxygen.   PHYSICAL EXAMINATION:  GENERAL:  82 y.o.-year-old patient lying in the bed with no acute distress.  EYES: Pupils equal, round, reactive to light and accommodation. No scleral icterus. Extraocular muscles intact.  HEENT: Head atraumatic, normocephalic. Oropharynx and nasopharynx clear.  NECK:  Supple, no jugular venous distention. No thyroid enlargement, no tenderness.  LUNGS: Normal breath sounds bilaterally, no wheezing, rales,rhonchi or crepitation. No use of accessory muscles of respiration.  CARDIOVASCULAR: S1, S2 normal. No murmurs, rubs, or gallops.  ABDOMEN: Soft, non-tender, non-distended. Bowel sounds present. No organomegaly or mass.  EXTREMITIES: No pedal edema, cyanosis, or clubbing.  NEUROLOGIC: Cranial nerves II through XII are intact. Muscle strength 5/5 in all extremities. Sensation intact. Gait not checked.  PSYCHIATRIC: The patient is alert.  SKIN: No obvious rash, lesion, or ulcer.   DATA REVIEW:   CBC  Recent Labs Lab 03/17/16 0441  WBC 7.8  HGB 12.4*  HCT 36.1*  PLT 196    Chemistries   Recent Labs Lab 03/16/16 1156 03/17/16 0441  NA 138 138  K 4.2 4.0  CL 105 105  CO2 25 28  GLUCOSE 90 96  BUN 17 13  CREATININE 0.76 0.65  CALCIUM 9.2 9.1  AST 18  --   ALT 8*  --   ALKPHOS 74  --   BILITOT 0.7  --      RADIOLOGY:  Ct Chest W Contrast  Result Date: 03/16/2016 CLINICAL DATA:  Followup lung cancer EXAM: CT CHEST WITH CONTRAST  TECHNIQUE: Multidetector CT imaging of the chest was performed during intravenous contrast administration. CONTRAST:  71m ISOVUE-300 IOPAMIDOL (ISOVUE-300) INJECTION 61% COMPARISON:  08/31/2015 FINDINGS: Cardiovascular: Normal heart size. No pericardial effusion. There is aortic atherosclerosis identified. Calcification in the left main coronary artery, LAD and left circumflex coronary arteries noted. There is a large clot scratch set there is a large embolus involving the left main pulmonary artery, image 63 of series 2. This is contiguous with the left upper lobe lung mass. I cannot be certain if this is tumor thrombus or bland thrombus. More distally within the left lower lobe pulmonary artery there is a a separate thrombus, image number 79 of series 2 this does not appear attached to the larger thrombus within the central left pulmonary artery and is favored to represent bland thrombus. Mediastinum/Nodes: Right hilar node Measures 1.3 cm, image 73 of series 2. This is difficult to visualize on the previous exam. No enlarged right paratracheal or sub- carinal lymph  nodes. Lungs/Pleura: Very small left pleural effusion. Mass involving the left upper lobe Measures 6.6 by 5.7 cm, image 33 of series 2. On the previous examination this measured 4.6 by 4.4 cm peer moderate to advanced changes of centrilobular and paraseptal emphysema identified. Scattered scar like densities noted in the right upper lobe and right middle lobe. Diffuse bronchial wall thickening noted. Upper Abdomen: Several scattered low-density foci are identified within the liver. These are favored to represent cysts. Nodule in the right adrenal gland measures 1.2 cm and is unchanged from previous exam. Musculoskeletal: No aggressive lytic or sclerotic bone lesions. IMPRESSION: 1. Left upper lobe lung mass has increased in size when compared with the previous exam compatible with progression of disease. 2. Large filling defect is identified within  the left main pulmonary artery. It is unclear whether not this represents bland thrombus or tumor thrombus as this appears contiguous with the left upper lobe lung mass. Smaller, separate pulmonary artery filling defects are identified within the left lower lobe compatible with bland a pulmonary emboli. Critical Value/emergent results were called by telephone at the time of interpretation on 03/16/2016 at 11:23 am to Dr. Lowell Bouton, who verbally acknowledged these results. Electronically Signed   By: Kerby Moors M.D.   On: 03/16/2016 11:23   US Venous Img Lower Bilateral  Result Date: 03/16/2016 CLINICAL DATA:  Pulmonary embolism EXAM: BILATERAL LOWER EXTREMITY VENOUS DUPLEX ULTRASOUND TECHNIQUE: Doppler venous assessment of the bilateral lower extremity deep venous system was performed, including characterization of spectral flow, compressibility, and phasicity. COMPARISON:  06/05/2012 FINDINGS: There is complete compressibility of the bilateral common femoral, femoral, and popliteal veins. Doppler analysis demonstrates respiratory phasicity and augmentation of flow with calf compression. No obvious superficial vein or calf vein thrombosis. IMPRESSION: No evidence of lower extremity DVT. Electronically Signed   By: Marybelle Killings M.D.   On: 03/16/2016 15:20    Management plans discussed with the patient, family and they are in agreement.  CODE STATUS:  Code Status History    Date Active Date Inactive Code Status Order ID Comments User Context   03/16/2016  1:17 PM 03/16/2016  6:43 PM Full Code [id number]  Hillary Bow, MD ED      TOTAL TIME TAKING CARE OF THIS PATIENT: 35 minutes.    Loletha Grayer M.D on 03/17/2016 at 6:20 PM  Between 7am to 6pm - Pager - [phone]  After 6pm go to [email or web address] - password [email or web address]  [facility] [employer] Office  [phone]  CC: Primary care physician; Kirk Ruths., MD

## 2016-03-17 NOTE — Care Management Obs Status (Signed)
MEDICARE OBSERVATION STATUS NOTIFICATION   Patient Details  Name: Johnathan Dunn MRN: [id number] Date of Birth: 1922-08-28   Medicare Observation Status Notification Given:  Yes    Jolly Mango, RN 03/17/2016, 9:16 AM

## 2016-03-17 NOTE — Care Management (Addendum)
Free 30 day Eliquis coupon given. Patient to be discharged home with his daughter who lives with him. Family lives close and is supportive. PCP Dr. Ouida Sills.  Patient qualify for home O2. Family updated (son -in -law), requested he stay until home O2 is delivered.  Ordered home O2 from [place] with [facility].

## 2016-03-17 NOTE — Consult Note (Signed)
[facility] NOTE  Patient Care Team: Kirk Ruths, MD as PCP - General (Internal Medicine)  CHIEF COMPLAINTS/PURPOSE OF CONSULTATION: DOS- 03/17/2016.   Lung cancer ; PE  HISTORY OF PRESENTING ILLNESS: Patient is a poor historian given dementia/accompanied by his daughter.  Johnathan Dunn 82 y.o.  male history of  Dementia; left upper lobe lung mass- presumed malignancy/based on clinical grounds [biopsy negative]- status post radiation 2013. Patient had a surveillance CT scan that showed progression of the disease in the left upper lobe approximately 6 cm in size. Incidentally patient was also noted to have PE. He admitted to the hospital for further management.  Patient is currently on Eliquis-. Tolerating well. No bleeding.  ROS: Limited because of dementia. As per the daughter- patient dementia is moderate.  MEDICAL HISTORY:  Past Medical History:  Diagnosis Date  . Glaucoma   . Lung cancer ([place])   . Urinary retention     SURGICAL HISTORY: Past Surgical History:  Procedure Laterality Date  . PACEMAKER INSERTION    . STOMACH SURGERY     patient describes something put in his stomach to help blood flow to legs    SOCIAL HISTORY: Social History   Social History  . Marital status: Widowed    Spouse name: N/A  . Number of children: N/A  . Years of education: N/A   Occupational History  . Not on file.   Social History Main Topics  . Smoking status: Former Research scientist (life sciences)  . Smokeless tobacco: Never Used     Comment: quit 40 years  . Alcohol use No  . Drug use: No  . Sexual activity: Not on file   Other Topics Concern  . Not on file   Social History Narrative  . No narrative on file    FAMILY HISTORY: Family History  Problem Relation Age of Onset  . Kidney disease Neg Hx   . Prostate cancer Neg Hx     ALLERGIES:  is allergic to donepezil.  MEDICATIONS:  No current facility-administered medications for this encounter.    Current  Outpatient Prescriptions  Medication Sig Dispense Refill  . budesonide-formoterol (SYMBICORT) 160-4.5 MCG/ACT inhaler Inhale 2 puffs into the lungs daily.    . cyanocobalamin (,VITAMIN B-12,) 1000 MCG/ML injection Inject 1,000 mcg into the muscle every 30 (thirty) days. Reported on 11/11/2015    . pravastatin (PRAVACHOL) 40 MG tablet TAKE 1 TABLET BY MOUTH EVERY DAY    . tamsulosin (FLOMAX) 0.4 MG CAPS capsule Take 1 capsule (0.4 mg total) by mouth daily. 90 capsule 3  . tiotropium (SPIRIVA HANDIHALER) 18 MCG inhalation capsule INHALE 1 CAPSULE VIA HANDIHALER EVERY DAY    . apixaban (ELIQUIS) 5 MG TABS tablet Two tablets po twice a day for one week then one tablet po twice a day afterwards 74 tablet 0  . tobramycin (TOBREX) 0.3 % ophthalmic solution Place 1 drop into both eyes 3 (three) times daily.         Marland Kitchen  PHYSICAL EXAMINATION:  Vitals:   03/17/16 1149 03/17/16 1151  BP: (!) 108/38 (!) 107/57  Pulse: 71 70  Resp: 19   Temp: 98.4 F (36.9 C)    Filed Weights   03/16/16 1138 03/16/16 1537 03/17/16 0500  Weight: 139 lb (63 kg) 138 lb (62.6 kg) 137 lb (62.1 kg)    GENERAL:  Alert, no distress and comfortable.   Accompanied by his daughter. Appears frail. EYES: no pallor or icterus OROPHARYNX: no thrush or  ulceration. NECK: supple, no masses felt LYMPH:  no palpable lymphadenopathy in the cervical, axillary or inguinal regions LUNGS: decreased breath sounds to auscultation at bases and  No wheeze or crackles HEART/CVS: regular rate & rhythm and no murmurs; No lower extremity edema ABDOMEN: abdomen soft, non-tender and normal bowel sounds Musculoskeletal:no cyanosis of digits and no clubbing  PSYCH: alert & oriented x 2-3 with fluent speech NEURO: no focal motor/sensory deficits SKIN:  no rashes or significant lesions  LABORATORY DATA:  I have reviewed the data as listed Lab Results  Component Value Date   WBC 7.8 03/17/2016   HGB 12.4 (L) 03/17/2016   HCT 36.1 (L)  03/17/2016   MCV 84.9 03/17/2016   PLT 196 03/17/2016    Recent Labs  08/26/15 1503 03/01/16 1500 03/16/16 1156 03/17/16 0441  NA 137 135 138 138  K 4.3 4.3 4.2 4.0  CL 102 100* 105 105  CO2 '23 25 25 28  '$ GLUCOSE 118* 104* 90 96  BUN 15 25* 17 13  CREATININE 0.99 1.03 0.76 0.65  CALCIUM 9.2 9.0 9.2 9.1  GFRNONAA >60 >60 >60 >60  GFRAA >60 >60 >60 >60  PROT 7.2 7.4 7.2  --   ALBUMIN 3.4* 3.5 3.2*  --   AST '16 17 18  '$ --   ALT 11* 9* 8*  --   ALKPHOS 71 76 74  --   BILITOT 0.8 0.6 0.7  --     RADIOGRAPHIC STUDIES: I have personally reviewed the radiological images as listed and agreed with the findings in the report. Ct Chest W Contrast  Result Date: 03/16/2016 CLINICAL DATA:  Followup lung cancer EXAM: CT CHEST WITH CONTRAST TECHNIQUE: Multidetector CT imaging of the chest was performed during intravenous contrast administration. CONTRAST:  59m ISOVUE-300 IOPAMIDOL (ISOVUE-300) INJECTION 61% COMPARISON:  08/31/2015 FINDINGS: Cardiovascular: Normal heart size. No pericardial effusion. There is aortic atherosclerosis identified. Calcification in the left main coronary artery, LAD and left circumflex coronary arteries noted. There is a large clot scratch set there is a large embolus involving the left main pulmonary artery, image 63 of series 2. This is contiguous with the left upper lobe lung mass. I cannot be certain if this is tumor thrombus or bland thrombus. More distally within the left lower lobe pulmonary artery there is a a separate thrombus, image number 79 of series 2 this does not appear attached to the larger thrombus within the central left pulmonary artery and is favored to represent bland thrombus. Mediastinum/Nodes: Right hilar node Measures 1.3 cm, image 73 of series 2. This is difficult to visualize on the previous exam. No enlarged right paratracheal or sub- carinal lymph nodes. Lungs/Pleura: Very small left pleural effusion. Mass involving the left upper lobe Measures  6.6 by 5.7 cm, image 33 of series 2. On the previous examination this measured 4.6 by 4.4 cm peer moderate to advanced changes of centrilobular and paraseptal emphysema identified. Scattered scar like densities noted in the right upper lobe and right middle lobe. Diffuse bronchial wall thickening noted. Upper Abdomen: Several scattered low-density foci are identified within the liver. These are favored to represent cysts. Nodule in the right adrenal gland measures 1.2 cm and is unchanged from previous exam. Musculoskeletal: No aggressive lytic or sclerotic bone lesions. IMPRESSION: 1. Left upper lobe lung mass has increased in size when compared with the previous exam compatible with progression of disease. 2. Large filling defect is identified within the left main pulmonary artery. It is unclear whether not  this represents bland thrombus or tumor thrombus as this appears contiguous with the left upper lobe lung mass. Smaller, separate pulmonary artery filling defects are identified within the left lower lobe compatible with bland a pulmonary emboli. Critical Value/emergent results were called by telephone at the time of interpretation on 03/16/2016 at 11:23 am to Dr. Lowell Bouton, who verbally acknowledged these results. Electronically Signed   By: Kerby Moors M.D.   On: 03/16/2016 11:23   US Venous Img Lower Bilateral  Result Date: 03/16/2016 CLINICAL DATA:  Pulmonary embolism EXAM: BILATERAL LOWER EXTREMITY VENOUS DUPLEX ULTRASOUND TECHNIQUE: Doppler venous assessment of the bilateral lower extremity deep venous system was performed, including characterization of spectral flow, compressibility, and phasicity. COMPARISON:  06/05/2012 FINDINGS: There is complete compressibility of the bilateral common femoral, femoral, and popliteal veins. Doppler analysis demonstrates respiratory phasicity and augmentation of flow with calf compression. No obvious superficial vein or calf vein thrombosis. IMPRESSION: No evidence  of lower extremity DVT. Electronically Signed   By: Marybelle Killings M.D.   On: 03/16/2016 15:20    ASSESSMENT & PLAN:   82 yo with recurrent left upper lobe lung ca s/p RT T3N0- [2013] with recent recurrence/local progression-currently admitted to hospital for incidental Pulmonary embolism.  # Pulmonary embolus- Left lung; Bil LE dopplers- Neg for DVT. Agree with Eliquis. At a long discussion regarding the importance of avoiding falls while on anticoagulation. Recommend Walker- during ambulation.  # Left upper Lobe lung ca-with local progression 6.6 x5.7 cm mass- recommend follow up with Dr.Finnegan re: treatment options. ? Immunotherapy.   # Comorbid conditions include dementia/ falls/ borderline performance status.  All questions were answered. The patient knows to call the clinic with any problems, questions or concerns.  # I reviewed the images myself and with the patient's daughter in detail. The above plan of care was discussed with daughter in detail. She agrees. We will follow up with Dr. Grayland Ormond outpatient.    Cammie Sickle, MD 03/18/2016 8:13 PM

## 2016-03-17 NOTE — Progress Notes (Signed)
SATURATION QUALIFICATIONS: (This note is used to comply with regulatory documentation for home oxygen)  Patient Saturations on Room Air at Rest = 86%  Patient Saturations on Room Air while Ambulating = n/a%  Patient Saturations on n/a Liters of oxygen while Ambulating = n/a%  Please briefly explain why patient needs home oxygen: desats at rest on room air

## 2016-03-17 NOTE — Consult Note (Signed)
   [facility] Inpatient Consult   03/17/2016  LINK BURGESON 02-19-1923 [id number]   Patient screened for potential [facility] services. Patient was eligible for [facility]. Went by to see patient and he had already been discharged. For questions please contact:  Iliana Hutt RN, [facility] Liaison  [phone]) Business Mobile [phone]) Toll free office

## 2016-03-18 ENCOUNTER — Ambulatory Visit: Payer: PPO | Admitting: Urology

## 2016-03-21 ENCOUNTER — Ambulatory Visit: Payer: PPO | Admitting: Urology

## 2016-03-22 DIAGNOSIS — J42 Unspecified chronic bronchitis: Secondary | ICD-10-CM | POA: Diagnosis not present

## 2016-03-22 DIAGNOSIS — C3490 Malignant neoplasm of unspecified part of unspecified bronchus or lung: Secondary | ICD-10-CM | POA: Diagnosis not present

## 2016-03-22 DIAGNOSIS — I2782 Chronic pulmonary embolism: Secondary | ICD-10-CM | POA: Diagnosis not present

## 2016-03-22 DIAGNOSIS — G301 Alzheimer's disease with late onset: Secondary | ICD-10-CM | POA: Diagnosis not present

## 2016-03-22 DIAGNOSIS — F0281 Dementia in other diseases classified elsewhere with behavioral disturbance: Secondary | ICD-10-CM | POA: Diagnosis not present

## 2016-03-23 ENCOUNTER — Ambulatory Visit: Payer: PPO | Admitting: Oncology

## 2016-03-28 ENCOUNTER — Inpatient Hospital Stay: Payer: PPO | Attending: Oncology | Admitting: Oncology

## 2016-03-28 VITALS — BP 107/70 | HR 84 | Temp 96.9°F | Resp 18 | Wt 138.4 lb

## 2016-03-28 DIAGNOSIS — Z79899 Other long term (current) drug therapy: Secondary | ICD-10-CM | POA: Insufficient documentation

## 2016-03-28 DIAGNOSIS — Z923 Personal history of irradiation: Secondary | ICD-10-CM | POA: Diagnosis not present

## 2016-03-28 DIAGNOSIS — C3412 Malignant neoplasm of upper lobe, left bronchus or lung: Secondary | ICD-10-CM | POA: Diagnosis not present

## 2016-03-28 DIAGNOSIS — I2699 Other pulmonary embolism without acute cor pulmonale: Secondary | ICD-10-CM | POA: Insufficient documentation

## 2016-03-28 DIAGNOSIS — Z87891 Personal history of nicotine dependence: Secondary | ICD-10-CM

## 2016-03-28 NOTE — Progress Notes (Signed)
[facility]  [phone] Fax:([phone]) [phone]  ID: Johnathan Dunn OB: 01-Dec-1922  MR#: [id number]  DTO#:[id number]  Patient Care Team: Kirk Ruths, MD as PCP - General (Internal Medicine)  CHIEF COMPLAINT: Clinical stage IIb neoplasm of left upper lobe lung.  INTERVAL HISTORY: Patient returns to clinic today for further evaluation and discussion of his imaging results. He continues to feel well and remains asymptomatic. He has no neurologic complaints. He denies any recent fevers or illnesses. He has a good appetite and denies weight loss. He denies any chest pain, shortness of breath, or hemoptysis. He has no nausea, vomiting, constipation, or diarrhea. He has no urinary complaints. Patient feels at his baseline and offers no specific complaints today.  REVIEW OF SYSTEMS:   Review of Systems  Constitutional: Negative.  Negative for fever, malaise/fatigue and weight loss.  Respiratory: Negative.  Negative for cough, hemoptysis and shortness of breath.   Cardiovascular: Negative.  Negative for chest pain.  Gastrointestinal: Negative.  Negative for abdominal pain.  Genitourinary: Negative.   Musculoskeletal: Negative.   Neurological: Negative.  Negative for weakness.  Psychiatric/Behavioral: Negative.     As per HPI. Otherwise, a complete review of systems is negatve.  PAST MEDICAL HISTORY: Past Medical History:  Diagnosis Date  . Glaucoma   . Lung cancer ([place])   . Urinary retention     PAST SURGICAL HISTORY: Past Surgical History:  Procedure Laterality Date  . PACEMAKER INSERTION    . STOMACH SURGERY     patient describes something put in his stomach to help blood flow to legs    FAMILY HISTORY: Family History  Problem Relation Age of Onset  . Kidney disease Neg Hx   . Prostate cancer Neg Hx        ADVANCED DIRECTIVES:    HEALTH MAINTENANCE: Social History  Substance Use Topics  . Smoking status: Former Research scientist (life sciences)  . Smokeless  tobacco: Never Used     Comment: quit 40 years  . Alcohol use No     Colonoscopy:  PAP:  Bone density:  Lipid panel:  Allergies  Allergen Reactions  . Donepezil Other (See Comments)    hallucinations    Current Outpatient Prescriptions  Medication Sig Dispense Refill  . apixaban (ELIQUIS) 5 MG TABS tablet Two tablets po twice a day for one week then one tablet po twice a day afterwards 74 tablet 0  . budesonide-formoterol (SYMBICORT) 160-4.5 MCG/ACT inhaler Inhale 2 puffs into the lungs daily.    . cyanocobalamin (,VITAMIN B-12,) 1000 MCG/ML injection Inject 1,000 mcg into the muscle every 30 (thirty) days. Reported on 11/11/2015    . pravastatin (PRAVACHOL) 40 MG tablet TAKE 1 TABLET BY MOUTH EVERY DAY    . tamsulosin (FLOMAX) 0.4 MG CAPS capsule Take 1 capsule (0.4 mg total) by mouth daily. 90 capsule 3  . tiotropium (SPIRIVA HANDIHALER) 18 MCG inhalation capsule INHALE 1 CAPSULE VIA HANDIHALER EVERY DAY    . tobramycin (TOBREX) 0.3 % ophthalmic solution Place 1 drop into both eyes 3 (three) times daily.      No current facility-administered medications for this visit.     OBJECTIVE: Vitals:   03/28/16 1355  BP: 107/70  Pulse: 84  Resp: 18  Temp: (!) 96.9 F (36.1 C)     Body mass index is 23.04 kg/m.    ECOG FS:0 - Asymptomatic  General: Well-developed, well-nourished, no acute distress. Eyes: Pink conjunctiva, anicteric sclera. HEENT: Normocephalic, moist mucous membranes, clear oropharnyx. Lungs: Clear  to auscultation bilaterally. Heart: Regular rate and rhythm. No rubs, murmurs, or gallops. Abdomen: Soft, nontender, nondistended. No organomegaly noted, normoactive bowel sounds. Musculoskeletal: No edema, cyanosis, or clubbing. Neuro: Alert, answering all questions appropriately. Cranial nerves grossly intact. Skin: No rashes or petechiae noted. Psych: Normal affect. Lymphatics: No cervical, calvicular, axillary or inguinal LAD.   LAB RESULTS:  Lab Results    Component Value Date   NA 138 03/17/2016   K 4.0 03/17/2016   CL 105 03/17/2016   CO2 28 03/17/2016   GLUCOSE 96 03/17/2016   BUN 13 03/17/2016   CREATININE 0.65 03/17/2016   CALCIUM 9.1 03/17/2016   PROT 7.2 03/16/2016   ALBUMIN 3.2 (L) 03/16/2016   AST 18 03/16/2016   ALT 8 (L) 03/16/2016   ALKPHOS 74 03/16/2016   BILITOT 0.7 03/16/2016   GFRNONAA >60 03/17/2016   GFRAA >60 03/17/2016    Lab Results  Component Value Date   WBC 7.8 03/17/2016   NEUTROABS 8.0 (H) 03/16/2016   HGB 12.4 (L) 03/17/2016   HCT 36.1 (L) 03/17/2016   MCV 84.9 03/17/2016   PLT 196 03/17/2016     STUDIES: Ct Chest W Contrast  Result Date: 03/16/2016 CLINICAL DATA:  Followup lung cancer EXAM: CT CHEST WITH CONTRAST TECHNIQUE: Multidetector CT imaging of the chest was performed during intravenous contrast administration. CONTRAST:  29m ISOVUE-300 IOPAMIDOL (ISOVUE-300) INJECTION 61% COMPARISON:  08/31/2015 FINDINGS: Cardiovascular: Normal heart size. No pericardial effusion. There is aortic atherosclerosis identified. Calcification in the left main coronary artery, LAD and left circumflex coronary arteries noted. There is a large clot scratch set there is a large embolus involving the left main pulmonary artery, image 63 of series 2. This is contiguous with the left upper lobe lung mass. I cannot be certain if this is tumor thrombus or bland thrombus. More distally within the left lower lobe pulmonary artery there is a a separate thrombus, image number 79 of series 2 this does not appear attached to the larger thrombus within the central left pulmonary artery and is favored to represent bland thrombus. Mediastinum/Nodes: Right hilar node Measures 1.3 cm, image 73 of series 2. This is difficult to visualize on the previous exam. No enlarged right paratracheal or sub- carinal lymph nodes. Lungs/Pleura: Very small left pleural effusion. Mass involving the left upper lobe Measures 6.6 by 5.7 cm, image 33 of  series 2. On the previous examination this measured 4.6 by 4.4 cm peer moderate to advanced changes of centrilobular and paraseptal emphysema identified. Scattered scar like densities noted in the right upper lobe and right middle lobe. Diffuse bronchial wall thickening noted. Upper Abdomen: Several scattered low-density foci are identified within the liver. These are favored to represent cysts. Nodule in the right adrenal gland measures 1.2 cm and is unchanged from previous exam. Musculoskeletal: No aggressive lytic or sclerotic bone lesions. IMPRESSION: 1. Left upper lobe lung mass has increased in size when compared with the previous exam compatible with progression of disease. 2. Large filling defect is identified within the left main pulmonary artery. It is unclear whether not this represents bland thrombus or tumor thrombus as this appears contiguous with the left upper lobe lung mass. Smaller, separate pulmonary artery filling defects are identified within the left lower lobe compatible with bland a pulmonary emboli. Critical Value/emergent results were called by telephone at the time of interpretation on 03/16/2016 at 11:23 am to Dr. BLowell Bouton who verbally acknowledged these results. Electronically Signed   By: TQueen SloughD.  On: 03/16/2016 11:23   US Venous Img Lower Bilateral  Result Date: 03/16/2016 CLINICAL DATA:  Pulmonary embolism EXAM: BILATERAL LOWER EXTREMITY VENOUS DUPLEX ULTRASOUND TECHNIQUE: Doppler venous assessment of the bilateral lower extremity deep venous system was performed, including characterization of spectral flow, compressibility, and phasicity. COMPARISON:  06/05/2012 FINDINGS: There is complete compressibility of the bilateral common femoral, femoral, and popliteal veins. Doppler analysis demonstrates respiratory phasicity and augmentation of flow with calf compression. No obvious superficial vein or calf vein thrombosis. IMPRESSION: No evidence of lower extremity DVT.  Electronically Signed   By: Marybelle Killings M.D.   On: 03/16/2016 15:20    ASSESSMENT: Clinical stage IIb neoplasm of left upper lobe lung.  PLAN:    1. 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 Repeat CT scan in September 2013 revealed progressive disease a second biopsy was completed which is also negative for malignant cells. CT scan results from March 16, 2016 reviewed independently and reported as above with continued progression of disease. Patient incidentally also noted to have a pulmonary embolism.  After lengthy discussion with the patient and his 2 daughters, they wish to attempt treatment with immunotherapy. Patient will return to clinic on April 19, 2016 to initiate cycle 1 of nivolumab. Patient will receive treatment every 2 weeks and will reimage in approximately December 2017.  2. Pulmonary embolism: Incidental finding. Continue Eliquis as prescribed.  Approximately 30 minutes was spent in discussion of which greater than 50% was consultation.  Patient expressed understanding and was in agreement with this plan. He also understands that He can call clinic at any time with any questions, concerns, or complaints.   Primary cancer of left upper lobe of lung [facility])   Staging form: Lung, AJCC 7th Edition   - Clinical stage from 03/05/2016: T2, N0, M0 - Signed by Lloyd Huger, MD on 03/05/2016  Lloyd Huger, MD   03/30/2016 8:28 AM

## 2016-03-28 NOTE — Progress Notes (Signed)
There was a pulmonary embolus- Left lung seen on CT and was transferred over to ER where he initiated Eliquis.

## 2016-03-29 NOTE — Patient Instructions (Signed)
Nivolumab injection What is this medicine? NIVOLUMAB (nye VOL ue mab) is a monoclonal antibody. It is used to treat melanoma, lung cancer, kidney cancer, and Hodgkin lymphoma. This medicine may be used for other purposes; ask your health care provider or pharmacist if you have questions. What should I tell my health care provider before I take this medicine? They need to know if you have any of these conditions: -diabetes -immune system problems -kidney disease -liver disease -lung disease -organ transplant -stomach or intestine problems -thyroid disease -an unusual or allergic reaction to nivolumab, other medicines, foods, dyes, or preservatives -pregnant or trying to get pregnant -breast-feeding How should I use this medicine? This medicine is for infusion into a vein. It is given by a health care professional in a hospital or clinic setting. A special MedGuide will be given to you before each treatment. Be sure to read this information carefully each time. Talk to your pediatrician regarding the use of this medicine in children. Special care may be needed. Overdosage: If you think you have taken too much of this medicine contact a poison control center or emergency room at once. NOTE: This medicine is only for you. Do not share this medicine with others. What if I miss a dose? It is important not to miss your dose. Call your doctor or health care professional if you are unable to keep an appointment. What may interact with this medicine? Interactions have not been studied. Give your health care provider a list of all the medicines, herbs, non-prescription drugs, or dietary supplements you use. Also tell them if you smoke, drink alcohol, or use illegal drugs. Some items may interact with your medicine. This list may not describe all possible interactions. Give your health care provider a list of all the medicines, herbs, non-prescription drugs, or dietary supplements you use. Also tell  them if you smoke, drink alcohol, or use illegal drugs. Some items may interact with your medicine. What should I watch for while using this medicine? This drug may make you feel generally unwell. Continue your course of treatment even though you feel ill unless your doctor tells you to stop. You may need blood work done while you are taking this medicine. Do not become pregnant while taking this medicine or for 5 months after stopping it. Women should inform their doctor if they wish to become pregnant or think they might be pregnant. There is a potential for serious side effects to an unborn child. Talk to your health care professional or pharmacist for more information. Do not breast-feed an infant while taking this medicine. What side effects may I notice from receiving this medicine? Side effects that you should report to your doctor or health care professional as soon as possible: -allergic reactions like skin rash, itching or hives, swelling of the face, lips, or tongue -black, tarry stools -blood in the urine -bloody or watery diarrhea -changes in vision -change in sex drive -changes in emotions or moods -chest pain -confusion -cough -decreased appetite -diarrhea -facial flushing -feeling faint or lightheaded -fever, chills -hair loss -hallucination, loss of contact with reality -headache -irritable -joint pain -loss of memory -muscle pain -muscle weakness -seizures -shortness of breath -signs and symptoms of high blood sugar such as dizziness; dry mouth; dry skin; fruity breath; nausea; stomach pain; increased hunger or thirst; increased urination -signs and symptoms of kidney injury like trouble passing urine or change in the amount of urine -signs and symptoms of liver injury like dark yellow or  brown urine; general ill feeling or flu-like symptoms; light-colored stools; loss of appetite; nausea; right upper belly pain; unusually weak or tired; yellowing of the eyes or  skin -stiff neck -swelling of the ankles, feet, hands -weight gain Side effects that usually do not require medical attention (report to your doctor or health care professional if they continue or are bothersome): -bone pain -constipation -tiredness -vomiting This list may not describe all possible side effects. Call your doctor for medical advice about side effects. You may report side effects to FDA at 1-800-FDA-1088. Where should I keep my medicine? This drug is given in a hospital or clinic and will not be stored at home. NOTE: This sheet is a summary. It may not cover all possible information. If you have questions about this medicine, talk to your doctor, pharmacist, or health care provider.    2016, Elsevier/Gold Standard. (20107/05/2017 10:03:42)

## 2016-04-05 ENCOUNTER — Inpatient Hospital Stay: Payer: PPO

## 2016-04-07 ENCOUNTER — Inpatient Hospital Stay: Payer: PPO

## 2016-04-12 ENCOUNTER — Encounter: Payer: Self-pay | Admitting: Urology

## 2016-04-12 ENCOUNTER — Ambulatory Visit (INDEPENDENT_AMBULATORY_CARE_PROVIDER_SITE_OTHER): Payer: PPO | Admitting: Urology

## 2016-04-12 VITALS — BP 93/62 | HR 90 | Ht 69.0 in | Wt 137.0 lb

## 2016-04-12 DIAGNOSIS — N138 Other obstructive and reflux uropathy: Secondary | ICD-10-CM

## 2016-04-12 DIAGNOSIS — N401 Enlarged prostate with lower urinary tract symptoms: Secondary | ICD-10-CM | POA: Diagnosis not present

## 2016-04-12 DIAGNOSIS — N400 Enlarged prostate without lower urinary tract symptoms: Secondary | ICD-10-CM | POA: Diagnosis not present

## 2016-04-12 LAB — BLADDER SCAN AMB NON-IMAGING: SCAN RESULT: 154

## 2016-04-12 MED ORDER — FINASTERIDE 5 MG PO TABS: 5.0000 mg | ORAL_TABLET | Freq: Every day | ORAL | 3 refills | Status: AC

## 2016-04-12 NOTE — Progress Notes (Signed)
2:22 PM   Johnathan Dunn 01-15-1923 [id number]  Referring provider: Kirk Ruths, MD [facility] [place], [place] [address]  Chief Complaint  Patient presents with  . Benign Prostatic Hypertrophy    3 month follow up     HPI: Patient is a 82 year old Caucasian male who presents today for a 3 month follow-up after undergoing a voiding trial for urinary retention.  Background history Patient had an incident of acute urinary retention who was referred to [place] by [facility] emergency department.  Patient is a poor historian and presented  with his son-in-law, Johnathan Dunn.   Neither, the patient or the son-in-law couldn't give a urinary history prior to the episode of urinary retention.  Patient states he was not having difficulty until he couldn't urinate for 2 days.  He was then he was carried to the emergency room and a Foley catheter was placed.  Greater than 1 L of urine was returned.  One week later, he underwent a voiding trial and successfully started passing urine.  Today, he is not having complaints.  His IPSS score is 2/2.  His PVR is 154 mL.   He has not had any episodes of gross hematuria, dysuria or suprapubic pain.   He has not had any recent fevers, chills, nausea or vomiting.  He is continuing the tamsulosin and finasteride as prescribed.      IPSS    Row Name 04/12/16 1400         International Prostate Symptom Score   How often have you had the sensation of not emptying your bladder? Not at All     How often have you had to urinate less than every two hours? Not at All     How often have you found you stopped and started again several times when you urinated? Not at All     How often have you found it difficult to postpone urination? Not at All     How often have you had a weak urinary stream? Less than 1 in 5 times     How often have you had to strain to start urination? Not at All     How many times did you typically get up at night to  urinate? 1 Time     Total IPSS Score 2       Quality of Life due to urinary symptoms   If you were to spend the rest of your life with your urinary condition just the way it is now how would you feel about that? Mostly Satisfied        Score:  1-7 Mild 8-19 Moderate 20-35 Severe    PMH: Past Medical History:  Diagnosis Date  . Glaucoma   . Lung cancer ([place])   . Urinary retention     Surgical History: Past Surgical History:  Procedure Laterality Date  . PACEMAKER INSERTION    . STOMACH SURGERY     patient describes something put in his stomach to help blood flow to legs    Home Medications:    Medication List       Accurate as of 04/12/16  2:22 PM. Always use your most recent med list.          apixaban 5 MG Tabs tablet Commonly known as:  ELIQUIS Two tablets po twice a day for one week then one tablet po twice a day afterwards   budesonide-formoterol 160-4.5 MCG/ACT inhaler Commonly  known as:  SYMBICORT Inhale 2 puffs into the lungs daily.   cyanocobalamin 1000 MCG/ML injection Commonly known as:  (VITAMIN B-12) Inject 1,000 mcg into the muscle every 30 (thirty) days. Reported on 11/11/2015   finasteride 5 MG tablet Commonly known as:  PROSCAR Take 1 tablet (5 mg total) by mouth daily.   pravastatin 40 MG tablet Commonly known as:  PRAVACHOL TAKE 1 TABLET BY MOUTH EVERY DAY   SPIRIVA HANDIHALER 18 MCG inhalation capsule Generic drug:  tiotropium INHALE 1 CAPSULE VIA HANDIHALER EVERY DAY   tamsulosin 0.4 MG Caps capsule Commonly known as:  FLOMAX Take 1 capsule (0.4 mg total) by mouth daily.   tobramycin 0.3 % ophthalmic solution Commonly known as:  TOBREX Place 1 drop into both eyes 3 (three) times daily.       Allergies:  Allergies  Allergen Reactions  . Donepezil Other (See Comments)    hallucinations    Family History: Family History  Problem Relation Age of Onset  . Kidney disease Neg Hx   . Prostate cancer Neg Hx     Social  History:  reports that he has quit smoking. He has never used smokeless tobacco. He reports that he does not drink alcohol or use drugs.  ROS: UROLOGY Frequent Urination?: No Hard to postpone urination?: No Burning/pain with urination?: No Get up at night to urinate?: No Leakage of urine?: No Urine stream starts and stops?: No Trouble starting stream?: No Do you have to strain to urinate?: No Blood in urine?: No Urinary tract infection?: No Sexually transmitted disease?: No Injury to kidneys or bladder?: No Painful intercourse?: No Weak stream?: No Erection problems?: No Penile pain?: No  Gastrointestinal Nausea?: No Vomiting?: No Indigestion/heartburn?: No Diarrhea?: No Constipation?: No  Constitutional Fever: No Night sweats?: No Weight loss?: No Fatigue?: No  Skin Skin rash/lesions?: No Itching?: No  Eyes Blurred vision?: No Double vision?: No  Ears/Nose/Throat Sore throat?: No Sinus problems?: No  Hematologic/Lymphatic Swollen glands?: No Easy bruising?: No  Cardiovascular Leg swelling?: No Chest pain?: No  Respiratory Cough?: No Shortness of breath?: No  Endocrine Excessive thirst?: No  Musculoskeletal Back pain?: No Joint pain?: No  Neurological Headaches?: No Dizziness?: No  Psychologic Depression?: No Anxiety?: No  Physical Exam: BP 93/62   Pulse 90   Ht '5\' 9"'$  (1.753 m)   Wt 137 lb (62.1 kg)   BMI 20.23 kg/m   Constitutional: Well nourished. Alert and oriented, No acute distress. HEENT: [employer] AT, moist mucus membranes. Trachea midline, no masses. Cardiovascular: No clubbing, cyanosis, or edema. Respiratory: Normal respiratory effort, no increased work of breathing. Skin: No rashes, bruises or suspicious lesions. Lymph: No cervical or inguinal adenopathy. Neurologic: Grossly intact, no focal deficits, moving all 4 extremities. Psychiatric: Normal mood and affect.  Laboratory Data: Lab Results  Component Value Date   WBC  7.8 03/17/2016   HGB 12.4 (L) 03/17/2016   HCT 36.1 (L) 03/17/2016   MCV 84.9 03/17/2016   PLT 196 03/17/2016    Lab Results  Component Value Date   CREATININE 0.65 03/17/2016    Lab Results  Component Value Date   TSH 1.23 02/10/2013    Lab Results  Component Value Date   AST 18 03/16/2016   Lab Results  Component Value Date   ALT 8 (L) 03/16/2016   Pertinent Imaging Results for Johnathan Dunn, Johnathan Dunn (MRN [id number]) as of 04/12/2016 14:15  Ref. Range 04/12/2016 14:04  Scan Result Unknown 154    Assessment &  Plan:    1. BPH with LUTS:    Passed voiding trial.   PVR 154 mL.  RTC in 12 month for IPSS score and PVR.  Continue tamsulosin 0.4 mg and finasteride 5 mg.  Finasteride refilled today.       Return in about 1 year (around 04/12/2017) for IPSS and PVR.  These notes generated with voice recognition software. I apologize for typographical errors.  Zara Council, [facility] [address], [place], [place] [address] [phone]

## 2016-04-18 NOTE — Progress Notes (Signed)
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  INTERVAL HISTORY: Patient returns to clinic today for further evaluation and consideration of cycle 1 of 6 of nivolumab. He continues to feel well and remains asymptomatic. He has no neurologic complaints. He denies any recent fevers or illnesses. He has a good appetite and denies weight loss. He denies any chest pain, shortness of breath, or hemoptysis. He has no nausea, vomiting, constipation, or diarrhea. He has no urinary complaints. Patient feels at his baseline and offers no specific complaints today.  REVIEW OF SYSTEMS:   Review of Systems  Constitutional: Negative.  Negative for fever, malaise/fatigue and weight loss.  Respiratory: Negative.  Negative for cough, hemoptysis and shortness of breath.   Cardiovascular: Negative.  Negative for chest pain.  Gastrointestinal: Negative.  Negative for abdominal pain.  Genitourinary: Negative.   Musculoskeletal: Negative.   Neurological: Negative.  Negative for weakness.  Psychiatric/Behavioral: Negative.     As per HPI. Otherwise, a complete review of systems is negative.  PAST MEDICAL HISTORY: Past Medical History:  Diagnosis Date  . Alzheimer's dementia   . Anemia   . COPD (chronic obstructive pulmonary disease) ([place])   . Glaucoma   . Hyperlipidemia   . Lung cancer ([place])   . Pacemaker   . Urinary retention     PAST SURGICAL HISTORY: Past Surgical History:  Procedure Laterality Date  . PACEMAKER INSERTION    . STOMACH SURGERY     patient describes something put in his stomach to help blood flow to legs    FAMILY HISTORY: Family History  Problem Relation Age of Onset  . Kidney disease Neg Hx   . Prostate cancer Neg Hx         ADVANCED DIRECTIVES:    HEALTH MAINTENANCE: Social History  Substance Use Topics  . Smoking status: Former Research scientist (life sciences)  . Smokeless tobacco: Never Used     Comment: quit 15 years  . Alcohol use No     Colonoscopy:  PAP:  Bone density:  Lipid panel:  Allergies  Allergen Reactions  . Donepezil Other (See Comments)    hallucinations    Current Outpatient Prescriptions  Medication Sig Dispense Refill  . apixaban (ELIQUIS) 5 MG TABS tablet Two tablets po twice a day for one week then one tablet po twice a day afterwards 74 tablet 0  . budesonide-formoterol (SYMBICORT) 160-4.5 MCG/ACT inhaler Inhale 2 puffs into the lungs daily.    . finasteride (PROSCAR) 5 MG tablet Take 1 tablet (5 mg total) by mouth daily. 90 tablet 3  . pravastatin (PRAVACHOL) 40 MG tablet TAKE 1 TABLET BY MOUTH EVERY DAY    . tamsulosin (FLOMAX) 0.4 MG CAPS capsule Take 1 capsule (0.4 mg total) by mouth daily. 90 capsule 3  . tiotropium (SPIRIVA HANDIHALER) 18 MCG inhalation capsule INHALE 1 CAPSULE VIA HANDIHALER EVERY DAY     No current facility-administered medications for this visit.     OBJECTIVE: Vitals:   04/19/16 0925  BP: 98/61  Pulse: (!) 58  Temp: 97.2 F (36.2 C)     Body mass index is 20.41 kg/m.    ECOG FS:0 - Asymptomatic  General: Well-developed, well-nourished, no acute distress. Eyes: Pink conjunctiva, anicteric sclera. Lungs: Clear to auscultation bilaterally. Heart: Regular rate  and rhythm. No rubs, murmurs, or gallops. Abdomen: Soft, nontender, nondistended. No organomegaly noted, normoactive bowel sounds. Musculoskeletal: No edema, cyanosis, or clubbing. Neuro: Alert, answering all questions appropriately. Cranial nerves grossly intact. Skin: No rashes or petechiae noted. Psych: Normal affect.   LAB RESULTS:  Lab Results  Component Value Date   NA 136 04/19/2016   K 4.0 04/19/2016   CL 103 04/19/2016   CO2 25 04/19/2016   GLUCOSE 119 (H) 04/19/2016   BUN 15  04/19/2016   CREATININE 0.85 04/19/2016   CALCIUM 9.3 04/19/2016   PROT 7.4 04/19/2016   ALBUMIN 3.1 (L) 04/19/2016   AST 15 04/19/2016   ALT 8 (L) 04/19/2016   ALKPHOS 69 04/19/2016   BILITOT 0.6 04/19/2016   GFRNONAA >60 04/19/2016   GFRAA >60 04/19/2016    Lab Results  Component Value Date   WBC 8.9 04/19/2016   NEUTROABS 6.6 (H) 04/19/2016   HGB 12.3 (L) 04/19/2016   HCT 36.9 (L) 04/19/2016   MCV 84.3 04/19/2016   PLT 255 04/19/2016     STUDIES: No results found.  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  PLAN:    1. 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 Repeat CT scan in September 2013 revealed progressive disease a second biopsy was completed which is also negative for malignant cells. CT scan results from March 16, 2016 reviewed independently and reported as above with continued progression of disease. Patient incidentally also noted to have a pulmonary embolism.  After lengthy discussion with the patient and his 2 daughters, they wish to attempt treatment with immunotherapy. Proceed with cycle 1 of 6 of nivolumab. Patient will receive treatment every 2 weeks and will reimage after 6 treatments in approximately December 2017. Return to clinic in 2 weeks for consideration of cycle 2. 2. Pulmonary embolism: Incidental finding. Continue Eliquis as prescribed.  Approximately 30 minutes was spent in discussion of which greater than 50% was consultation.  Patient expressed understanding and was in agreement with this plan. He also understands that He can call clinic at any time with any questions, concerns, or complaints.   Primary cancer of left upper lobe of lung [facility])   Staging form: Lung, AJCC 7th Edition   - Clinical stage from 03/05/2016: T2, N0, M0 - Signed by Lloyd Huger, MD on 03/05/2016  Lloyd Huger, MD   04/19/2016  12:03 PM

## 2016-04-19 ENCOUNTER — Inpatient Hospital Stay: Payer: PPO | Attending: Oncology | Admitting: Oncology

## 2016-04-19 ENCOUNTER — Inpatient Hospital Stay: Payer: PPO

## 2016-04-19 ENCOUNTER — Encounter: Payer: Self-pay | Admitting: Oncology

## 2016-04-19 ENCOUNTER — Other Ambulatory Visit: Payer: Self-pay

## 2016-04-19 VITALS — BP 98/61 | HR 58 | Temp 97.2°F | Wt 138.2 lb

## 2016-04-19 DIAGNOSIS — Z79899 Other long term (current) drug therapy: Secondary | ICD-10-CM | POA: Diagnosis not present

## 2016-04-19 DIAGNOSIS — Z7901 Long term (current) use of anticoagulants: Secondary | ICD-10-CM | POA: Insufficient documentation

## 2016-04-19 DIAGNOSIS — J449 Chronic obstructive pulmonary disease, unspecified: Secondary | ICD-10-CM | POA: Insufficient documentation

## 2016-04-19 DIAGNOSIS — E785 Hyperlipidemia, unspecified: Secondary | ICD-10-CM | POA: Insufficient documentation

## 2016-04-19 DIAGNOSIS — Z950 Presence of cardiac pacemaker: Secondary | ICD-10-CM | POA: Insufficient documentation

## 2016-04-19 DIAGNOSIS — I2699 Other pulmonary embolism without acute cor pulmonale: Secondary | ICD-10-CM | POA: Insufficient documentation

## 2016-04-19 DIAGNOSIS — Z923 Personal history of irradiation: Secondary | ICD-10-CM | POA: Diagnosis not present

## 2016-04-19 DIAGNOSIS — G309 Alzheimer's disease, unspecified: Secondary | ICD-10-CM | POA: Diagnosis not present

## 2016-04-19 DIAGNOSIS — Z5111 Encounter for antineoplastic chemotherapy: Secondary | ICD-10-CM | POA: Insufficient documentation

## 2016-04-19 DIAGNOSIS — C3412 Malignant neoplasm of upper lobe, left bronchus or lung: Secondary | ICD-10-CM

## 2016-04-19 DIAGNOSIS — Z87891 Personal history of nicotine dependence: Secondary | ICD-10-CM | POA: Insufficient documentation

## 2016-04-19 DIAGNOSIS — F0280 Dementia in other diseases classified elsewhere without behavioral disturbance: Secondary | ICD-10-CM | POA: Insufficient documentation

## 2016-04-19 LAB — COMPREHENSIVE METABOLIC PANEL
ALBUMIN: 3.1 g/dL — AB (ref 3.5–5.0)
ALT: 8 U/L — ABNORMAL LOW (ref 17–63)
AST: 15 U/L (ref 15–41)
Alkaline Phosphatase: 69 U/L (ref 38–126)
Anion gap: 8 (ref 5–15)
BUN: 15 mg/dL (ref 6–20)
CHLORIDE: 103 mmol/L (ref 101–111)
CO2: 25 mmol/L (ref 22–32)
Calcium: 9.3 mg/dL (ref 8.9–10.3)
Creatinine, Ser: 0.85 mg/dL (ref 0.61–1.24)
GFR calc Af Amer: >60 mL/min (ref >60)
GLUCOSE: 119 mg/dL — AB (ref 65–99)
POTASSIUM: 4 mmol/L (ref 3.5–5.1)
Sodium: 136 mmol/L (ref 135–145)
Total Bilirubin: 0.6 mg/dL (ref 0.3–1.2)
Total Protein: 7.4 g/dL (ref 6.5–8.1)

## 2016-04-19 LAB — CBC WITH DIFFERENTIAL/PLATELET
BASOS ABS: 0 10*3/uL (ref 0–0.1)
BASOS PCT: 0 %
EOS ABS: 0.1 10*3/uL (ref 0–0.7)
EOS PCT: 2 %
HCT: 36.9 % — ABNORMAL LOW (ref 40.0–52.0)
Hemoglobin: 12.3 g/dL — ABNORMAL LOW (ref 13.0–18.0)
Lymphocytes Relative: 16 %
Lymphs Abs: 1.5 10*3/uL (ref 1.0–3.6)
MCH: 28.1 pg (ref 26.0–34.0)
MCHC: 33.4 g/dL (ref 32.0–36.0)
MCV: 84.3 fL (ref 80.0–100.0)
MONO ABS: 0.7 10*3/uL (ref 0.2–1.0)
Monocytes Relative: 8 %
Neutro Abs: 6.6 10*3/uL — ABNORMAL HIGH (ref 1.4–6.5)
Neutrophils Relative %: 74 %
PLATELETS: 255 10*3/uL (ref 150–440)
RBC: 4.37 MIL/uL — ABNORMAL LOW (ref 4.40–5.90)
RDW: 15.4 % — AB (ref 11.5–14.5)
WBC: 8.9 10*3/uL (ref 3.8–10.6)

## 2016-04-19 MED ORDER — SODIUM CHLORIDE 0.9 % IV SOLN
Freq: Once | INTRAVENOUS | Status: AC
  Administered 2016-04-19: 10:00:00 via INTRAVENOUS
  Filled 2016-04-19: qty 1000

## 2016-04-19 MED ORDER — SODIUM CHLORIDE 0.9 % IV SOLN
240.0000 mg | Freq: Once | INTRAVENOUS | Status: AC
  Administered 2016-04-19: 240 mg via INTRAVENOUS
  Filled 2016-04-19: qty 4

## 2016-04-20 LAB — THYROID PANEL WITH TSH
FREE THYROXINE INDEX: 1.9 (ref 1.2–4.9)
T3 Uptake Ratio: 27 % (ref 24–39)
T4, Total: 6.9 ug/dL (ref 4.5–12.0)
TSH: 1.61 u[IU]/mL (ref 0.450–4.500)

## 2016-05-02 NOTE — Progress Notes (Signed)
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  INTERVAL HISTORY: Johnathan Dunn returns to clinic today for further evaluation and consideration of cycle 2 of 6 of nivolumab. He a decreased appetite. He had several loose stools since his last treatment, but this has resolved. He otherwise feels well and remains asymptomatic. He has no neurologic complaints. He denies any recent fevers or illnesses. He has a good appetite and denies weight loss. He denies any chest pain, shortness of breath, or hemoptysis. He has no nausea, vomiting, constipation, or diarrhea. He has no urinary complaints. Johnathan Dunn offers no further specific complaints today.  REVIEW OF SYSTEMS:   Review of Systems  Constitutional: Negative.  Negative for fever, malaise/fatigue and weight loss.  Respiratory: Negative.  Negative for cough, hemoptysis and shortness of breath.   Cardiovascular: Negative.  Negative for chest pain.  Gastrointestinal: Negative.  Negative for abdominal pain.  Genitourinary: Negative.   Musculoskeletal: Negative.   Neurological: Negative.  Negative for weakness.  Psychiatric/Behavioral: Negative.     As per HPI. Otherwise, a complete review of systems is negative.  PAST MEDICAL HISTORY: Past Medical History:  Diagnosis Date  . Alzheimer's dementia   . Anemia   . COPD (chronic obstructive pulmonary disease) ([place])   . Glaucoma   . Hyperlipidemia   . Lung cancer ([place])   . Pacemaker   . Urinary retention     PAST SURGICAL HISTORY: Past Surgical History:  Procedure Laterality Date  . PACEMAKER INSERTION    . STOMACH SURGERY     Johnathan Dunn describes something put in his stomach to help blood flow to legs    FAMILY HISTORY: Family History  Problem  Relation Age of Onset  . Kidney disease Neg Hx   . Prostate cancer Neg Hx        ADVANCED DIRECTIVES:    HEALTH MAINTENANCE: Social History  Substance Use Topics  . Smoking status: Former Research scientist (life sciences)  . Smokeless tobacco: Never Used     Comment: quit 15 years  . Alcohol use No     Colonoscopy:  PAP:  Bone density:  Lipid panel:  Allergies  Allergen Reactions  . Donepezil Other (See Comments)    hallucinations    Current Outpatient Prescriptions  Medication Sig Dispense Refill  . apixaban (ELIQUIS) 5 MG TABS tablet Two tablets po twice a day for one week then one tablet po twice a day afterwards 74 tablet 0  . budesonide-formoterol (SYMBICORT) 160-4.5 MCG/ACT inhaler Inhale 2 puffs into the lungs daily.    . finasteride (PROSCAR) 5 MG tablet Take 1 tablet (5 mg total) by mouth daily. 90 tablet 3  . pravastatin (PRAVACHOL) 40 MG tablet TAKE 1 TABLET BY MOUTH EVERY DAY    . tamsulosin (FLOMAX) 0.4 MG CAPS capsule Take 1 capsule (0.4 mg total) by mouth daily. 90 capsule 3  . tiotropium (SPIRIVA HANDIHALER) 18 MCG inhalation capsule INHALE 1 CAPSULE VIA HANDIHALER EVERY DAY     No current facility-administered medications for this visit.     OBJECTIVE: Vitals:   05/03/16 1018  BP: (!) 88/57  Pulse: 97  Resp: 18  Temp: (!) 95.8 F (35.4 C)     Body mass index is 19.97 kg/m.    ECOG FS:0 - Asymptomatic  General: Well-developed, well-nourished,  no acute distress. Eyes: Pink conjunctiva, anicteric sclera. Lungs: Clear to auscultation bilaterally. Heart: Regular rate and rhythm. No rubs, murmurs, or gallops. Abdomen: Soft, nontender, nondistended. No organomegaly noted, normoactive bowel sounds. Musculoskeletal: No edema, cyanosis, or clubbing. Neuro: Alert, answering all questions appropriately. Cranial nerves grossly intact. Skin: No rashes or petechiae noted. Psych: Normal affect.   LAB RESULTS:  Lab Results  Component Value Date   NA 135 05/03/2016   K 4.2  05/03/2016   CL 102 05/03/2016   CO2 25 05/03/2016   GLUCOSE 121 (H) 05/03/2016   BUN 18 05/03/2016   CREATININE 0.85 05/03/2016   CALCIUM 9.2 05/03/2016   PROT 7.4 05/03/2016   ALBUMIN 3.1 (L) 05/03/2016   AST 18 05/03/2016   ALT 10 (L) 05/03/2016   ALKPHOS 70 05/03/2016   BILITOT 0.5 05/03/2016   GFRNONAA >60 05/03/2016   GFRAA >60 05/03/2016    Lab Results  Component Value Date   WBC 10.3 05/03/2016   NEUTROABS 7.1 (H) 05/03/2016   HGB 11.7 (L) 05/03/2016   HCT 35.1 (L) 05/03/2016   MCV 82.4 05/03/2016   PLT 285 05/03/2016     STUDIES: No results found.  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  PLAN:    1. 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 Repeat CT scan in September 2013 revealed progressive disease a second biopsy was completed which is also negative for malignant cells. CT scan results from March 16, 2016 reviewed independently and reported as above with continued progression of disease. Johnathan Dunn incidentally also noted to have a pulmonary embolism.  After lengthy discussion with the Johnathan Dunn and his 2 daughters, they wish to attempt treatment with immunotherapy. Proceed with cycle 2 of 6 of nivolumab. Johnathan Dunn will receive treatment every 2 weeks and will reimage after 6 treatments in approximately December 2017. Return to clinic in 2 weeks for consideration of cycle 3. 2. Pulmonary embolism: Incidental finding. Continue Eliquis as prescribed. 3. Loose stools: Continue OTC Imodium, monitor. 4. Hypotension: Johnathan Dunn is asymptomatic, monitor.  Approximately 30 minutes was spent in discussion of which greater than 50% was consultation.  Johnathan Dunn expressed understanding and was in agreement with this plan. He also understands that He can call clinic at any time with any questions, concerns, or complaints.   Primary cancer of left upper  lobe of lung [facility])   Staging form: Lung, AJCC 7th Edition   - Clinical stage from 03/05/2016: T2, N0, M0 - Signed by Lloyd Huger, MD on 03/05/2016  Lloyd Huger, MD   05/05/2016 9:14 AM

## 2016-05-03 ENCOUNTER — Inpatient Hospital Stay (HOSPITAL_BASED_OUTPATIENT_CLINIC_OR_DEPARTMENT_OTHER): Payer: PPO | Admitting: Oncology

## 2016-05-03 ENCOUNTER — Inpatient Hospital Stay: Payer: PPO

## 2016-05-03 VITALS — BP 88/57 | HR 97 | Temp 95.8°F | Resp 18 | Wt 135.3 lb

## 2016-05-03 DIAGNOSIS — Z7901 Long term (current) use of anticoagulants: Secondary | ICD-10-CM

## 2016-05-03 DIAGNOSIS — Z923 Personal history of irradiation: Secondary | ICD-10-CM | POA: Diagnosis not present

## 2016-05-03 DIAGNOSIS — C3412 Malignant neoplasm of upper lobe, left bronchus or lung: Secondary | ICD-10-CM

## 2016-05-03 DIAGNOSIS — I2699 Other pulmonary embolism without acute cor pulmonale: Secondary | ICD-10-CM | POA: Diagnosis not present

## 2016-05-03 DIAGNOSIS — Z5111 Encounter for antineoplastic chemotherapy: Secondary | ICD-10-CM | POA: Diagnosis not present

## 2016-05-03 DIAGNOSIS — Z79899 Other long term (current) drug therapy: Secondary | ICD-10-CM

## 2016-05-03 LAB — COMPREHENSIVE METABOLIC PANEL
ALK PHOS: 70 U/L (ref 38–126)
ALT: 10 U/L — AB (ref 17–63)
AST: 18 U/L (ref 15–41)
Albumin: 3.1 g/dL — ABNORMAL LOW (ref 3.5–5.0)
Anion gap: 8 (ref 5–15)
BUN: 18 mg/dL (ref 6–20)
CALCIUM: 9.2 mg/dL (ref 8.9–10.3)
CHLORIDE: 102 mmol/L (ref 101–111)
CO2: 25 mmol/L (ref 22–32)
CREATININE: 0.85 mg/dL (ref 0.61–1.24)
GFR calc Af Amer: >60 mL/min (ref >60)
Glucose, Bld: 121 mg/dL — ABNORMAL HIGH (ref 65–99)
Potassium: 4.2 mmol/L (ref 3.5–5.1)
Sodium: 135 mmol/L (ref 135–145)
Total Bilirubin: 0.5 mg/dL (ref 0.3–1.2)
Total Protein: 7.4 g/dL (ref 6.5–8.1)

## 2016-05-03 LAB — CBC WITH DIFFERENTIAL/PLATELET
BASOS ABS: 0 10*3/uL (ref 0–0.1)
Basophils Relative: 0 %
EOS PCT: 2 %
Eosinophils Absolute: 0.2 10*3/uL (ref 0–0.7)
HCT: 35.1 % — ABNORMAL LOW (ref 40.0–52.0)
HEMOGLOBIN: 11.7 g/dL — AB (ref 13.0–18.0)
LYMPHS ABS: 2 10*3/uL (ref 1.0–3.6)
LYMPHS PCT: 19 %
MCH: 27.4 pg (ref 26.0–34.0)
MCHC: 33.3 g/dL (ref 32.0–36.0)
MCV: 82.4 fL (ref 80.0–100.0)
Monocytes Absolute: 0.9 10*3/uL (ref 0.2–1.0)
Monocytes Relative: 9 %
NEUTROS ABS: 7.1 10*3/uL — AB (ref 1.4–6.5)
NEUTROS PCT: 70 %
PLATELETS: 285 10*3/uL (ref 150–440)
RBC: 4.26 MIL/uL — AB (ref 4.40–5.90)
RDW: 15.7 % — ABNORMAL HIGH (ref 11.5–14.5)
WBC: 10.3 10*3/uL (ref 3.8–10.6)

## 2016-05-03 MED ORDER — SODIUM CHLORIDE 0.9 % IV SOLN
Freq: Once | INTRAVENOUS | Status: AC
Start: 2016-05-03 — End: 2016-05-03
  Administered 2016-05-03: 11:00:00 via INTRAVENOUS
  Filled 2016-05-03: qty 1000

## 2016-05-03 MED ORDER — SODIUM CHLORIDE 0.9 % IV SOLN
240.0000 mg | Freq: Once | INTRAVENOUS | Status: AC
  Administered 2016-05-03: 240 mg via INTRAVENOUS
  Filled 2016-05-03: qty 20

## 2016-05-03 NOTE — Progress Notes (Signed)
States is having decreased appetite and had about 3 episodes of diarrhea after last treatment.

## 2016-05-04 LAB — THYROID PANEL WITH TSH
Free Thyroxine Index: 2 (ref 1.2–4.9)
T3 Uptake Ratio: 29 % (ref 24–39)
T4, Total: 7 ug/dL (ref 4.5–12.0)
TSH: 1.77 u[IU]/mL (ref 0.450–4.500)

## 2016-05-17 ENCOUNTER — Ambulatory Visit: Payer: PPO

## 2016-05-17 ENCOUNTER — Other Ambulatory Visit: Payer: PPO

## 2016-05-17 ENCOUNTER — Ambulatory Visit: Payer: PPO | Admitting: Oncology

## 2016-05-18 ENCOUNTER — Ambulatory Visit: Payer: PPO | Admitting: Oncology

## 2016-05-18 ENCOUNTER — Ambulatory Visit: Payer: PPO

## 2016-05-18 ENCOUNTER — Other Ambulatory Visit: Payer: PPO

## 2016-05-18 NOTE — Progress Notes (Signed)
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  INTERVAL HISTORY: Patient returns to clinic today for further evaluation and consideration of cycle 3 of 6 of nivolumab. His weakness and fatigue are significantly worse and his appetite is declining.  He has no neurologic complaints. He denies any recent fevers or illnesses. He denies any chest pain, shortness of breath, or hemoptysis. He has no nausea, vomiting, constipation, or diarrhea. He has no urinary complaints. Patient offers no further specific complaints today.  REVIEW OF SYSTEMS:   Review of Systems  Constitutional: Positive for malaise/fatigue. Negative for fever and weight loss.  Respiratory: Negative.  Negative for cough, hemoptysis and shortness of breath.   Cardiovascular: Negative.  Negative for chest pain.  Gastrointestinal: Negative.  Negative for abdominal pain, constipation, diarrhea, nausea and vomiting.  Genitourinary: Negative.   Musculoskeletal: Negative.   Neurological: Positive for weakness.  Psychiatric/Behavioral: Negative.  The patient is not nervous/anxious.     As per HPI. Otherwise, a complete review of systems is negative.  PAST MEDICAL HISTORY: Past Medical History:  Diagnosis Date  . Alzheimer's dementia   . Anemia   . COPD (chronic obstructive pulmonary disease) ([place])   . Glaucoma   . Hyperlipidemia   . Lung cancer ([place])   . Pacemaker   . Urinary retention     PAST SURGICAL HISTORY: Past Surgical History:  Procedure Laterality Date  . PACEMAKER INSERTION    . STOMACH SURGERY     patient describes something put in his stomach to help blood flow to legs    FAMILY HISTORY: Family History  Problem Relation Age of Onset  . Kidney disease Neg  Hx   . Prostate cancer Neg Hx        ADVANCED DIRECTIVES:    HEALTH MAINTENANCE: Social History  Substance Use Topics  . Smoking status: Former Research scientist (life sciences)  . Smokeless tobacco: Never Used     Comment: quit 15 years  . Alcohol use No     Colonoscopy:  PAP:  Bone density:  Lipid panel:  Allergies  Allergen Reactions  . Donepezil Other (See Comments)    hallucinations    Current Outpatient Prescriptions  Medication Sig Dispense Refill  . apixaban (ELIQUIS) 5 MG TABS tablet Two tablets po twice a day for one week then one tablet po twice a day afterwards 74 tablet 0  . budesonide-formoterol (SYMBICORT) 160-4.5 MCG/ACT inhaler Inhale 2 puffs into the lungs daily.    . finasteride (PROSCAR) 5 MG tablet Take 1 tablet (5 mg total) by mouth daily. 90 tablet 3  . pravastatin (PRAVACHOL) 40 MG tablet TAKE 1 TABLET BY MOUTH EVERY DAY    . tamsulosin (FLOMAX) 0.4 MG CAPS capsule Take 1 capsule (0.4 mg total) by mouth daily. 90 capsule 3  . tiotropium (SPIRIVA HANDIHALER) 18 MCG inhalation capsule INHALE 1 CAPSULE VIA HANDIHALER EVERY DAY     No current facility-administered medications for this visit.     OBJECTIVE: Vitals:   05/19/16 0934  BP: (!) 90/55  Pulse: 97  Resp: 18  Temp: (!) 96.9 F (36.1 C)     Body mass index is 20.33 kg/m.    ECOG FS:0 - Asymptomatic  General: Well-developed, well-nourished, no acute distress. Eyes: Pink conjunctiva, anicteric sclera. Lungs: Clear to  auscultation bilaterally. Heart: Regular rate and rhythm. No rubs, murmurs, or gallops. Abdomen: Soft, nontender, nondistended. No organomegaly noted, normoactive bowel sounds. Musculoskeletal: No edema, cyanosis, or clubbing. Neuro: Alert, answering all questions appropriately. Cranial nerves grossly intact. Skin: No rashes or petechiae noted. Psych: Normal affect.   LAB RESULTS:  Lab Results  Component Value Date   NA 139 05/19/2016   K 4.6 05/19/2016   CL 104 05/19/2016   CO2 26  05/19/2016   GLUCOSE 102 (H) 05/19/2016   BUN 18 05/19/2016   CREATININE 0.86 05/19/2016   CALCIUM 9.2 05/19/2016   PROT 7.2 05/19/2016   ALBUMIN 3.2 (L) 05/19/2016   AST 20 05/19/2016   ALT 9 (L) 05/19/2016   ALKPHOS 74 05/19/2016   BILITOT 0.6 05/19/2016   GFRNONAA >60 05/19/2016   GFRAA >60 05/19/2016    Lab Results  Component Value Date   WBC 8.1 05/19/2016   NEUTROABS 5.4 05/19/2016   HGB 11.9 (L) 05/19/2016   HCT 36.3 (L) 05/19/2016   MCV 81.0 05/19/2016   PLT 245 05/19/2016     STUDIES: No results found.  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  PLAN:    1. 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 Repeat CT scan in September 2013 revealed progressive disease a second biopsy was completed which is also negative for malignant cells. CT scan results from March 16, 2016 reviewed independently and reported as above with continued progression of disease. Patient incidentally also noted to have a pulmonary embolism.  After lengthy discussion with the patient and his daughter, they wish to discontinue treatment at this time. No further intervention or treatment is planned. Return to clinic in 1 month for further evaluation. At that point, we will determine whether to even consider repeat imaging. Hospice was discussed, the patient is not yet ready to enroll. 2. Pulmonary embolism: Incidental finding. Continue Eliquis as prescribed. 3. Loose stools: Continue OTC Imodium, monitor. 4. Hypotension: Patient is asymptomatic, monitor.  Approximately 30 minutes was spent in discussion of which greater than 50% was consultation.  Patient expressed understanding and was in agreement with this plan. He also understands that He can call clinic at any time with any questions, concerns, or complaints.   Primary cancer of left upper lobe of lung [facility])    Staging form: Lung, AJCC 7th Edition   - Clinical stage from 03/05/2016: T2, N0, M0 - Signed by Lloyd Huger, MD on 03/05/2016  Lloyd Huger, MD   05/23/2016 9:26 AM

## 2016-05-19 ENCOUNTER — Inpatient Hospital Stay: Payer: PPO

## 2016-05-19 ENCOUNTER — Inpatient Hospital Stay (HOSPITAL_BASED_OUTPATIENT_CLINIC_OR_DEPARTMENT_OTHER): Payer: PPO | Admitting: Oncology

## 2016-05-19 ENCOUNTER — Inpatient Hospital Stay: Payer: PPO | Attending: Oncology

## 2016-05-19 VITALS — BP 90/55 | HR 97 | Temp 96.9°F | Resp 18 | Wt 137.7 lb

## 2016-05-19 DIAGNOSIS — Z86711 Personal history of pulmonary embolism: Secondary | ICD-10-CM

## 2016-05-19 DIAGNOSIS — E785 Hyperlipidemia, unspecified: Secondary | ICD-10-CM | POA: Diagnosis not present

## 2016-05-19 DIAGNOSIS — Z79899 Other long term (current) drug therapy: Secondary | ICD-10-CM | POA: Diagnosis not present

## 2016-05-19 DIAGNOSIS — C3412 Malignant neoplasm of upper lobe, left bronchus or lung: Secondary | ICD-10-CM | POA: Insufficient documentation

## 2016-05-19 DIAGNOSIS — Z7901 Long term (current) use of anticoagulants: Secondary | ICD-10-CM | POA: Diagnosis not present

## 2016-05-19 DIAGNOSIS — J449 Chronic obstructive pulmonary disease, unspecified: Secondary | ICD-10-CM | POA: Insufficient documentation

## 2016-05-19 DIAGNOSIS — H409 Unspecified glaucoma: Secondary | ICD-10-CM | POA: Diagnosis not present

## 2016-05-19 DIAGNOSIS — I959 Hypotension, unspecified: Secondary | ICD-10-CM

## 2016-05-19 DIAGNOSIS — G309 Alzheimer's disease, unspecified: Secondary | ICD-10-CM | POA: Insufficient documentation

## 2016-05-19 DIAGNOSIS — F0280 Dementia in other diseases classified elsewhere without behavioral disturbance: Secondary | ICD-10-CM | POA: Insufficient documentation

## 2016-05-19 DIAGNOSIS — Z950 Presence of cardiac pacemaker: Secondary | ICD-10-CM | POA: Diagnosis not present

## 2016-05-19 DIAGNOSIS — Z923 Personal history of irradiation: Secondary | ICD-10-CM | POA: Insufficient documentation

## 2016-05-19 DIAGNOSIS — Z87891 Personal history of nicotine dependence: Secondary | ICD-10-CM | POA: Insufficient documentation

## 2016-05-19 LAB — CBC WITH DIFFERENTIAL/PLATELET
BASOS ABS: 0 10*3/uL (ref 0–0.1)
BASOS PCT: 0 %
EOS ABS: 0.4 10*3/uL (ref 0–0.7)
Eosinophils Relative: 5 %
HEMATOCRIT: 36.3 % — AB (ref 40.0–52.0)
HEMOGLOBIN: 11.9 g/dL — AB (ref 13.0–18.0)
Lymphocytes Relative: 18 %
Lymphs Abs: 1.5 10*3/uL (ref 1.0–3.6)
MCH: 26.6 pg (ref 26.0–34.0)
MCHC: 32.8 g/dL (ref 32.0–36.0)
MCV: 81 fL (ref 80.0–100.0)
MONOS PCT: 9 %
Monocytes Absolute: 0.7 10*3/uL (ref 0.2–1.0)
NEUTROS ABS: 5.4 10*3/uL (ref 1.4–6.5)
NEUTROS PCT: 68 %
Platelets: 245 10*3/uL (ref 150–440)
RBC: 4.49 MIL/uL (ref 4.40–5.90)
RDW: 16.9 % — ABNORMAL HIGH (ref 11.5–14.5)
WBC: 8.1 10*3/uL (ref 3.8–10.6)

## 2016-05-19 LAB — COMPREHENSIVE METABOLIC PANEL
ALBUMIN: 3.2 g/dL — AB (ref 3.5–5.0)
ALK PHOS: 74 U/L (ref 38–126)
ALT: 9 U/L — ABNORMAL LOW (ref 17–63)
ANION GAP: 9 (ref 5–15)
AST: 20 U/L (ref 15–41)
BILIRUBIN TOTAL: 0.6 mg/dL (ref 0.3–1.2)
BUN: 18 mg/dL (ref 6–20)
CALCIUM: 9.2 mg/dL (ref 8.9–10.3)
CO2: 26 mmol/L (ref 22–32)
Chloride: 104 mmol/L (ref 101–111)
Creatinine, Ser: 0.86 mg/dL (ref 0.61–1.24)
GFR calc Af Amer: >60 mL/min (ref >60)
GFR calc non Af Amer: >60 mL/min (ref >60)
GLUCOSE: 102 mg/dL — AB (ref 65–99)
POTASSIUM: 4.6 mmol/L (ref 3.5–5.1)
SODIUM: 139 mmol/L (ref 135–145)
TOTAL PROTEIN: 7.2 g/dL (ref 6.5–8.1)

## 2016-05-19 NOTE — Progress Notes (Signed)
States since started treatment has developed decreased appetite and lethargy. Requests to stop all treatment at this time and transition back to follow up.

## 2016-05-20 LAB — THYROID PANEL WITH TSH
Free Thyroxine Index: 1.9 (ref 1.2–4.9)
T3 Uptake Ratio: 27 % (ref 24–39)
T4, Total: 7.2 ug/dL (ref 4.5–12.0)
TSH: 2.77 u[IU]/mL (ref 0.450–4.500)

## 2016-06-02 ENCOUNTER — Ambulatory Visit: Payer: PPO | Admitting: Oncology

## 2016-06-02 ENCOUNTER — Ambulatory Visit: Payer: PPO

## 2016-06-02 ENCOUNTER — Other Ambulatory Visit: Payer: PPO

## 2016-06-06 DIAGNOSIS — H353211 Exudative age-related macular degeneration, right eye, with active choroidal neovascularization: Secondary | ICD-10-CM | POA: Diagnosis not present

## 2016-06-06 DIAGNOSIS — H35422 Microcystoid degeneration of retina, left eye: Secondary | ICD-10-CM | POA: Diagnosis not present

## 2016-06-06 DIAGNOSIS — H43813 Vitreous degeneration, bilateral: Secondary | ICD-10-CM | POA: Diagnosis not present

## 2016-06-06 DIAGNOSIS — H353123 Nonexudative age-related macular degeneration, left eye, advanced atrophic without subfoveal involvement: Secondary | ICD-10-CM | POA: Diagnosis not present

## 2016-06-14 DIAGNOSIS — L853 Xerosis cutis: Secondary | ICD-10-CM | POA: Diagnosis not present

## 2016-06-19 NOTE — Progress Notes (Deleted)
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  INTERVAL HISTORY: Patient returns to clinic today for further evaluation and consideration of cycle 3 of 6 of nivolumab. His weakness and fatigue are significantly worse and his appetite is declining.  He has no neurologic complaints. He denies any recent fevers or illnesses. He denies any chest pain, shortness of breath, or hemoptysis. He has no nausea, vomiting, constipation, or diarrhea. He has no urinary complaints. Patient offers no further specific complaints today.  REVIEW OF SYSTEMS:   Review of Systems  Constitutional: Positive for malaise/fatigue. Negative for fever and weight loss.  Respiratory: Negative.  Negative for cough, hemoptysis and shortness of breath.   Cardiovascular: Negative.  Negative for chest pain.  Gastrointestinal: Negative.  Negative for abdominal pain, constipation, diarrhea, nausea and vomiting.  Genitourinary: Negative.   Musculoskeletal: Negative.   Neurological: Positive for weakness.  Psychiatric/Behavioral: Negative.  The patient is not nervous/anxious.     As per HPI. Otherwise, a complete review of systems is negative.  PAST MEDICAL HISTORY: Past Medical History:  Diagnosis Date  . Alzheimer's dementia   . Anemia   . COPD (chronic obstructive pulmonary disease) ([place])   . Glaucoma   . Hyperlipidemia   . Lung cancer ([place])   . Pacemaker   . Urinary retention     PAST SURGICAL HISTORY: Past Surgical History:  Procedure Laterality Date  . PACEMAKER INSERTION    . STOMACH SURGERY     patient describes something put in his stomach to help blood flow to legs    FAMILY HISTORY: Family History  Problem Relation Age of Onset  . Kidney disease Neg  Hx   . Prostate cancer Neg Hx        ADVANCED DIRECTIVES:    HEALTH MAINTENANCE: Social History  Substance Use Topics  . Smoking status: Former Research scientist (life sciences)  . Smokeless tobacco: Never Used     Comment: quit 15 years  . Alcohol use No     Colonoscopy:  PAP:  Bone density:  Lipid panel:  Allergies  Allergen Reactions  . Donepezil Other (See Comments)    hallucinations    Current Outpatient Prescriptions  Medication Sig Dispense Refill  . apixaban (ELIQUIS) 5 MG TABS tablet Two tablets po twice a day for one week then one tablet po twice a day afterwards 74 tablet 0  . budesonide-formoterol (SYMBICORT) 160-4.5 MCG/ACT inhaler Inhale 2 puffs into the lungs daily.    . finasteride (PROSCAR) 5 MG tablet Take 1 tablet (5 mg total) by mouth daily. 90 tablet 3  . pravastatin (PRAVACHOL) 40 MG tablet TAKE 1 TABLET BY MOUTH EVERY DAY    . tamsulosin (FLOMAX) 0.4 MG CAPS capsule Take 1 capsule (0.4 mg total) by mouth daily. 90 capsule 3  . tiotropium (SPIRIVA HANDIHALER) 18 MCG inhalation capsule INHALE 1 CAPSULE VIA HANDIHALER EVERY DAY     No current facility-administered medications for this visit.     OBJECTIVE: There were no vitals filed for this visit.   There is no height or weight on file to calculate BMI.    ECOG FS:0 - Asymptomatic  General: Well-developed, well-nourished, no acute distress. Eyes: Pink conjunctiva, anicteric sclera. Lungs: Clear to auscultation bilaterally. Heart: Regular rate and rhythm. No rubs, murmurs, or  gallops. Abdomen: Soft, nontender, nondistended. No organomegaly noted, normoactive bowel sounds. Musculoskeletal: No edema, cyanosis, or clubbing. Neuro: Alert, answering all questions appropriately. Cranial nerves grossly intact. Skin: No rashes or petechiae noted. Psych: Normal affect.   LAB RESULTS:  Lab Results  Component Value Date   NA 139 05/19/2016   K 4.6 05/19/2016   CL 104 05/19/2016   CO2 26 05/19/2016   GLUCOSE 102 (H)  05/19/2016   BUN 18 05/19/2016   CREATININE 0.86 05/19/2016   CALCIUM 9.2 05/19/2016   PROT 7.2 05/19/2016   ALBUMIN 3.2 (L) 05/19/2016   AST 20 05/19/2016   ALT 9 (L) 05/19/2016   ALKPHOS 74 05/19/2016   BILITOT 0.6 05/19/2016   GFRNONAA >60 05/19/2016   GFRAA >60 05/19/2016    Lab Results  Component Value Date   WBC 8.1 05/19/2016   NEUTROABS 5.4 05/19/2016   HGB 11.9 (L) 05/19/2016   HCT 36.3 (L) 05/19/2016   MCV 81.0 05/19/2016   PLT 245 05/19/2016     STUDIES: No results found.  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  PLAN:    1. 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 Repeat CT scan in September 2013 revealed progressive disease a second biopsy was completed which is also negative for malignant cells. CT scan results from March 16, 2016 reviewed independently and reported as above with continued progression of disease. Patient incidentally also noted to have a pulmonary embolism.  After lengthy discussion with the patient and his daughter, they wish to discontinue treatment at this time. No further intervention or treatment is planned. Return to clinic in 1 month for further evaluation. At that point, we will determine whether to even consider repeat imaging. Hospice was discussed, the patient is not yet ready to enroll. 2. Pulmonary embolism: Incidental finding. Continue Eliquis as prescribed. 3. Loose stools: Continue OTC Imodium, monitor. 4. Hypotension: Patient is asymptomatic, monitor.  Approximately 30 minutes was spent in discussion of which greater than 50% was consultation.  Patient expressed understanding and was in agreement with this plan. He also understands that He can call clinic at any time with any questions, concerns, or complaints.   Primary cancer of left upper lobe of lung [facility])   Staging form: Lung, AJCC 7th  Edition   - Clinical stage from 03/05/2016: T2, N0, M0 - Signed by Lloyd Huger, MD on 03/05/2016  Lloyd Huger, MD   06/19/2016 5:55 PM

## 2016-06-20 ENCOUNTER — Inpatient Hospital Stay: Payer: PPO | Admitting: Oncology

## 2016-06-28 DIAGNOSIS — I442 Atrioventricular block, complete: Secondary | ICD-10-CM | POA: Diagnosis not present

## 2016-07-11 DIAGNOSIS — G301 Alzheimer's disease with late onset: Secondary | ICD-10-CM | POA: Diagnosis not present

## 2016-07-11 DIAGNOSIS — C3490 Malignant neoplasm of unspecified part of unspecified bronchus or lung: Secondary | ICD-10-CM | POA: Diagnosis not present

## 2016-07-11 DIAGNOSIS — J42 Unspecified chronic bronchitis: Secondary | ICD-10-CM | POA: Diagnosis not present

## 2016-07-11 DIAGNOSIS — E7800 Pure hypercholesterolemia, unspecified: Secondary | ICD-10-CM | POA: Diagnosis not present

## 2016-07-11 DIAGNOSIS — I2782 Chronic pulmonary embolism: Secondary | ICD-10-CM | POA: Diagnosis not present

## 2016-07-11 DIAGNOSIS — F0281 Dementia in other diseases classified elsewhere with behavioral disturbance: Secondary | ICD-10-CM | POA: Diagnosis not present

## 2016-07-13 DIAGNOSIS — H35422 Microcystoid degeneration of retina, left eye: Secondary | ICD-10-CM | POA: Diagnosis not present

## 2016-07-13 DIAGNOSIS — H43813 Vitreous degeneration, bilateral: Secondary | ICD-10-CM | POA: Diagnosis not present

## 2016-07-13 DIAGNOSIS — H353211 Exudative age-related macular degeneration, right eye, with active choroidal neovascularization: Secondary | ICD-10-CM | POA: Diagnosis not present

## 2016-07-13 DIAGNOSIS — H353123 Nonexudative age-related macular degeneration, left eye, advanced atrophic without subfoveal involvement: Secondary | ICD-10-CM | POA: Diagnosis not present

## 2016-08-10 DIAGNOSIS — H353123 Nonexudative age-related macular degeneration, left eye, advanced atrophic without subfoveal involvement: Secondary | ICD-10-CM | POA: Diagnosis not present

## 2016-08-10 DIAGNOSIS — H43813 Vitreous degeneration, bilateral: Secondary | ICD-10-CM | POA: Diagnosis not present

## 2016-08-10 DIAGNOSIS — H35422 Microcystoid degeneration of retina, left eye: Secondary | ICD-10-CM | POA: Diagnosis not present

## 2016-08-10 DIAGNOSIS — H353211 Exudative age-related macular degeneration, right eye, with active choroidal neovascularization: Secondary | ICD-10-CM | POA: Diagnosis not present

## 2016-09-08 DIAGNOSIS — G301 Alzheimer's disease with late onset: Secondary | ICD-10-CM | POA: Diagnosis not present

## 2016-09-08 DIAGNOSIS — F0281 Dementia in other diseases classified elsewhere with behavioral disturbance: Secondary | ICD-10-CM | POA: Diagnosis not present

## 2016-09-08 DIAGNOSIS — I739 Peripheral vascular disease, unspecified: Secondary | ICD-10-CM | POA: Diagnosis not present

## 2016-09-08 DIAGNOSIS — D510 Vitamin B12 deficiency anemia due to intrinsic factor deficiency: Secondary | ICD-10-CM | POA: Diagnosis not present

## 2016-09-08 DIAGNOSIS — J42 Unspecified chronic bronchitis: Secondary | ICD-10-CM | POA: Diagnosis not present

## 2016-09-08 DIAGNOSIS — I2782 Chronic pulmonary embolism: Secondary | ICD-10-CM | POA: Diagnosis not present

## 2016-09-08 DIAGNOSIS — E7800 Pure hypercholesterolemia, unspecified: Secondary | ICD-10-CM | POA: Diagnosis not present

## 2016-09-09 ENCOUNTER — Telehealth: Payer: Self-pay | Admitting: *Deleted

## 2016-09-09 NOTE — Telephone Encounter (Signed)
Hospice referral has been sent.

## 2016-09-09 NOTE — Telephone Encounter (Signed)
PCP referred to hospice for end stage COPD and Dementia, but this does not qualify him for services. Asking if his lung mass qualifies him for hospice care. Please advise and contact Hospice with decision

## 2016-09-09 NOTE — Telephone Encounter (Signed)
I would like end-stage COPD would be more likely hospice diagnosis than stage II lung cancer, but it is reasonable to try.  Thanks.

## 2016-09-09 NOTE — Telephone Encounter (Signed)
[place] requests we send an order for Hospice and the last office note as well. THank you

## 2016-09-13 ENCOUNTER — Telehealth: Payer: Self-pay | Admitting: *Deleted

## 2016-09-13 MED ORDER — ALBUTEROL SULFATE HFA 108 (90 BASE) MCG/ACT IN AERS: 2.0000 | INHALATION_SPRAY | Freq: Four times a day (QID) | RESPIRATORY_TRACT | 2 refills | Status: AC | PRN

## 2016-09-13 NOTE — Telephone Encounter (Signed)
Asking if he is to stop his Eliquis. Also asking for a rx for rescue inhaler of albuterol. Please advise

## 2016-09-13 NOTE — Telephone Encounter (Signed)
Stop Eliquis, OK to order Albuterol, Sonia Baller informed and rx e scribed to [facility] per request

## 2016-09-21 DIAGNOSIS — H353223 Exudative age-related macular degeneration, left eye, with inactive scar: Secondary | ICD-10-CM | POA: Diagnosis not present

## 2016-09-21 DIAGNOSIS — H43813 Vitreous degeneration, bilateral: Secondary | ICD-10-CM | POA: Diagnosis not present

## 2016-09-21 DIAGNOSIS — H353211 Exudative age-related macular degeneration, right eye, with active choroidal neovascularization: Secondary | ICD-10-CM | POA: Diagnosis not present

## 2016-09-21 DIAGNOSIS — H4321 Crystalline deposits in vitreous body, right eye: Secondary | ICD-10-CM | POA: Diagnosis not present

## 2016-09-29 DIAGNOSIS — J42 Unspecified chronic bronchitis: Secondary | ICD-10-CM | POA: Diagnosis not present

## 2016-09-29 DIAGNOSIS — I2782 Chronic pulmonary embolism: Secondary | ICD-10-CM | POA: Diagnosis not present

## 2016-09-29 DIAGNOSIS — J449 Chronic obstructive pulmonary disease, unspecified: Secondary | ICD-10-CM | POA: Diagnosis not present

## 2016-09-29 DIAGNOSIS — C3412 Malignant neoplasm of upper lobe, left bronchus or lung: Secondary | ICD-10-CM | POA: Diagnosis not present

## 2016-10-27 ENCOUNTER — Telehealth: Payer: Self-pay | Admitting: *Deleted

## 2016-10-27 MED ORDER — MORPHINE SULFATE (CONCENTRATE) 10 MG /0.5 ML PO SOLN: ORAL | 0 refills | Status: DC

## 2016-10-27 NOTE — Telephone Encounter (Signed)
Roxanol faxed. Msg left for [facility]

## 2016-10-27 NOTE — Telephone Encounter (Signed)
Patient with increased SOB and they never got comfort kit orders back, They have set up O2, but not helping, Asking for Roxanol order.

## 2016-11-07 ENCOUNTER — Telehealth: Payer: Self-pay

## 2016-11-07 ENCOUNTER — Other Ambulatory Visit: Payer: Self-pay

## 2016-11-07 DIAGNOSIS — G893 Neoplasm related pain (acute) (chronic): Secondary | ICD-10-CM

## 2016-11-07 DIAGNOSIS — C3412 Malignant neoplasm of upper lobe, left bronchus or lung: Secondary | ICD-10-CM

## 2016-11-07 MED ORDER — MORPHINE SULFATE (CONCENTRATE) 10 MG /0.5 ML PO SOLN: ORAL | 0 refills | Status: DC

## 2016-11-07 MED ORDER — MORPHINE SULFATE (CONCENTRATE) 10 MG /0.5 ML PO SOLN
ORAL | 0 refills | Status: DC
Start: 2016-11-07 — End: 2016-11-07

## 2016-11-07 NOTE — Telephone Encounter (Signed)
Hospice called [facility] and made [place] aware that at home caretaker spilled the remaining amount of the patient's morphine.  This was witnessed by hospice staff.  Prescription sent to [facility] to refill the morphine.

## 2016-11-18 ENCOUNTER — Other Ambulatory Visit: Payer: Self-pay | Admitting: *Deleted

## 2016-11-18 MED ORDER — MORPHINE SULFATE (CONCENTRATE) 20 MG/ML PO SOLN: ORAL | 0 refills | Status: AC

## 2016-11-18 NOTE — Telephone Encounter (Signed)
Per Dr Mike Gip, since he is no longer in the offending home, ok to refill. Tina notified and Rx faxed

## 2016-11-18 NOTE — Telephone Encounter (Signed)
Whilst staying one daughter his morphine disappeared (presumably the granddaughter took it) He is very SOB today and needs more Roxanol He is now staying with the other daughter. Please advise

## 2016-11-29 DIAGNOSIS — H43813 Vitreous degeneration, bilateral: Secondary | ICD-10-CM | POA: Diagnosis not present

## 2016-11-29 DIAGNOSIS — H353223 Exudative age-related macular degeneration, left eye, with inactive scar: Secondary | ICD-10-CM | POA: Diagnosis not present

## 2016-11-29 DIAGNOSIS — H35422 Microcystoid degeneration of retina, left eye: Secondary | ICD-10-CM | POA: Diagnosis not present

## 2016-11-29 DIAGNOSIS — H353211 Exudative age-related macular degeneration, right eye, with active choroidal neovascularization: Secondary | ICD-10-CM | POA: Diagnosis not present

## 2016-12-28 ENCOUNTER — Other Ambulatory Visit: Payer: Self-pay | Admitting: Oncology

## 2017-02-05 DEATH — deceased

## 2017-02-11 ENCOUNTER — Other Ambulatory Visit: Payer: Self-pay | Admitting: Nurse Practitioner

## 2017-04-12 ENCOUNTER — Ambulatory Visit: Payer: PPO | Admitting: Urology
